# Patient Record
Sex: Female | Born: 1965 | Race: White | Hispanic: No | Marital: Married | Smoking: Never smoker
Health system: Southern US, Community
[De-identification: ages and names within clinical notes are randomized; demographics above are authoritative.]

## PROBLEM LIST (undated history)

## (undated) DIAGNOSIS — N200 Calculus of kidney: Secondary | ICD-10-CM

## (undated) DIAGNOSIS — E785 Hyperlipidemia, unspecified: Secondary | ICD-10-CM

## (undated) DIAGNOSIS — M1990 Unspecified osteoarthritis, unspecified site: Secondary | ICD-10-CM

## (undated) DIAGNOSIS — R896 Abnormal cytological findings in specimens from other organs, systems and tissues: Secondary | ICD-10-CM

## (undated) DIAGNOSIS — G43909 Migraine, unspecified, not intractable, without status migrainosus: Secondary | ICD-10-CM

## (undated) DIAGNOSIS — I10 Essential (primary) hypertension: Secondary | ICD-10-CM

## (undated) HISTORY — PX: KIDNEY SURGERY: SHX687

## (undated) HISTORY — DX: Unspecified osteoarthritis, unspecified site: M19.90

## (undated) HISTORY — DX: Essential (primary) hypertension: I10

## (undated) HISTORY — PX: BASAL CELL CARCINOMA EXCISION: SHX1214

## (undated) HISTORY — DX: Calculus of kidney: N20.0

## (undated) HISTORY — DX: Abnormal cytological findings in specimens from other organs, systems and tissues: R89.6

## (undated) HISTORY — DX: Migraine, unspecified, not intractable, without status migrainosus: G43.909

## (undated) HISTORY — DX: Hyperlipidemia, unspecified: E78.5

---
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
## 1998-04-22 ENCOUNTER — Inpatient Hospital Stay (HOSPITAL_COMMUNITY): Admission: AD | Admit: 1998-04-22 | Discharge: 1998-04-27 | Payer: Self-pay | Admitting: Obstetrics and Gynecology

## 1999-10-01 ENCOUNTER — Encounter: Payer: Self-pay | Admitting: Emergency Medicine

## 1999-10-01 ENCOUNTER — Emergency Department (HOSPITAL_COMMUNITY): Admission: EM | Admit: 1999-10-01 | Discharge: 1999-10-01 | Payer: Self-pay | Admitting: Emergency Medicine

## 1999-10-04 ENCOUNTER — Encounter: Payer: Self-pay | Admitting: Urology

## 1999-10-04 ENCOUNTER — Ambulatory Visit (HOSPITAL_COMMUNITY): Admission: RE | Admit: 1999-10-04 | Discharge: 1999-10-04 | Payer: Self-pay | Admitting: Urology

## 2000-10-09 ENCOUNTER — Encounter: Payer: Self-pay | Admitting: Urology

## 2000-10-09 ENCOUNTER — Encounter: Admission: RE | Admit: 2000-10-09 | Discharge: 2000-10-09 | Payer: Self-pay | Admitting: Urology

## 2001-09-13 ENCOUNTER — Other Ambulatory Visit: Admission: RE | Admit: 2001-09-13 | Discharge: 2001-09-13 | Payer: Self-pay | Admitting: Obstetrics and Gynecology

## 2002-07-28 ENCOUNTER — Other Ambulatory Visit: Admission: RE | Admit: 2002-07-28 | Discharge: 2002-07-28 | Payer: Self-pay | Admitting: Gynecology

## 2003-02-25 ENCOUNTER — Inpatient Hospital Stay (HOSPITAL_COMMUNITY): Admission: AD | Admit: 2003-02-25 | Discharge: 2003-02-28 | Payer: Self-pay | Admitting: Gynecology

## 2003-04-08 ENCOUNTER — Other Ambulatory Visit: Admission: RE | Admit: 2003-04-08 | Discharge: 2003-04-08 | Payer: Self-pay | Admitting: Gynecology

## 2004-08-03 ENCOUNTER — Other Ambulatory Visit: Admission: RE | Admit: 2004-08-03 | Discharge: 2004-08-03 | Payer: Self-pay | Admitting: Gynecology

## 2005-08-21 ENCOUNTER — Other Ambulatory Visit: Admission: RE | Admit: 2005-08-21 | Discharge: 2005-08-21 | Payer: Self-pay | Admitting: Gynecology

## 2006-09-03 ENCOUNTER — Other Ambulatory Visit: Admission: RE | Admit: 2006-09-03 | Discharge: 2006-09-03 | Payer: Self-pay | Admitting: Gynecology

## 2007-01-09 ENCOUNTER — Ambulatory Visit (HOSPITAL_COMMUNITY): Admission: RE | Admit: 2007-01-09 | Discharge: 2007-01-09 | Payer: Self-pay | Admitting: Urology

## 2007-01-12 ENCOUNTER — Emergency Department (HOSPITAL_COMMUNITY): Admission: EM | Admit: 2007-01-12 | Discharge: 2007-01-12 | Payer: Self-pay | Admitting: Emergency Medicine

## 2007-09-26 DIAGNOSIS — IMO0001 Reserved for inherently not codable concepts without codable children: Secondary | ICD-10-CM

## 2007-09-26 HISTORY — DX: Reserved for inherently not codable concepts without codable children: IMO0001

## 2007-09-26 HISTORY — PX: COLPOSCOPY: SHX161

## 2007-09-30 ENCOUNTER — Other Ambulatory Visit: Admission: RE | Admit: 2007-09-30 | Discharge: 2007-09-30 | Payer: Self-pay | Admitting: Gynecology

## 2008-03-10 ENCOUNTER — Emergency Department (HOSPITAL_COMMUNITY): Admission: EM | Admit: 2008-03-10 | Discharge: 2008-03-10 | Payer: Self-pay | Admitting: Emergency Medicine

## 2008-10-29 ENCOUNTER — Ambulatory Visit: Payer: Self-pay | Admitting: Gynecology

## 2008-10-29 ENCOUNTER — Other Ambulatory Visit: Admission: RE | Admit: 2008-10-29 | Discharge: 2008-10-29 | Payer: Self-pay | Admitting: Gynecology

## 2008-10-29 ENCOUNTER — Encounter: Payer: Self-pay | Admitting: Gynecology

## 2008-12-27 IMAGING — CT CT ABDOMEN W/O CM
2 of 5 series · 17 of 46 positions shown, 19 images · non-contrast
Comparison: None

CT ABDOMEN

CLINICAL DATA: Right flank pain history of renal stones

CT OF THE ABDOMEN AND PELVIS WITHOUT CONTRAST (CT UROGRAM)
TECHNIQUE: Multidetector CT imaging was performed through the
abdomen and pelvis to include the urinary tract.

[Series 2: 160 stone 5.0 b40f st · axial · 0.63mm/px · z∈[+696,+1026]mm · 14 of 72 slices shown, 16 images]
[im 3/72  soft-tissue]
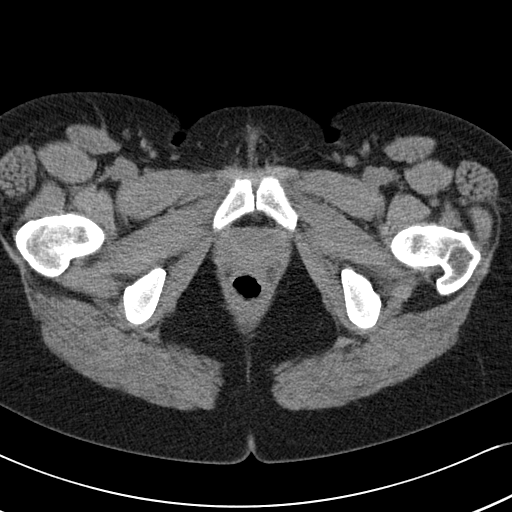
[im 3/72  bone]
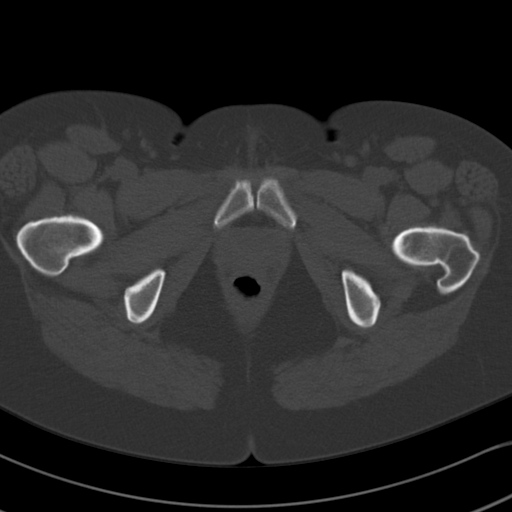
[im 9/72  soft-tissue]
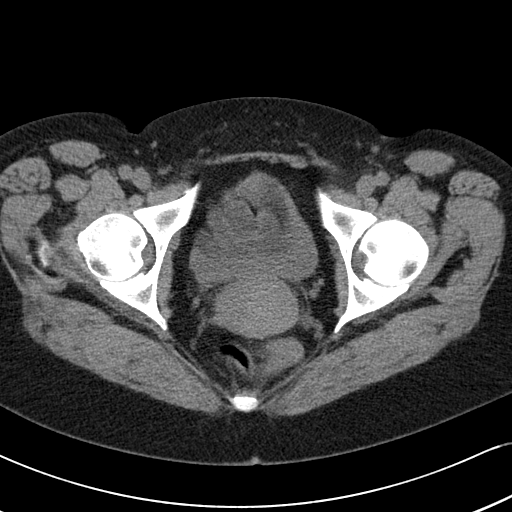
[im 14/72  soft-tissue]
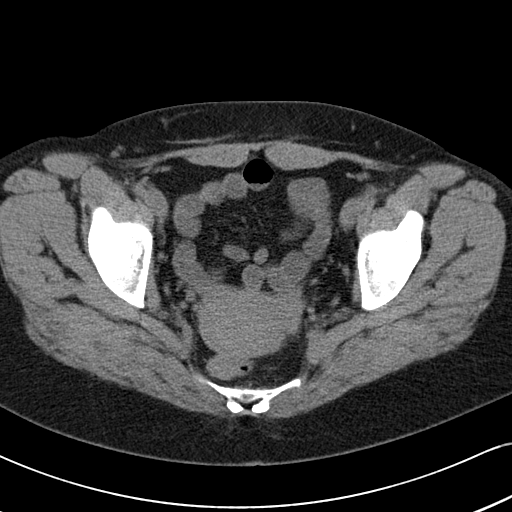
[im 20/72  soft-tissue]
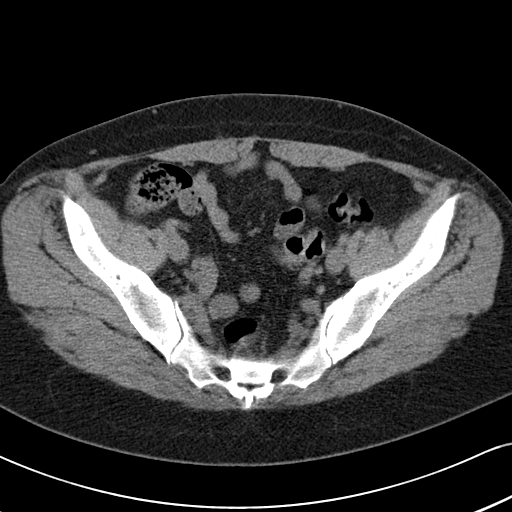
[im 25/72  soft-tissue]
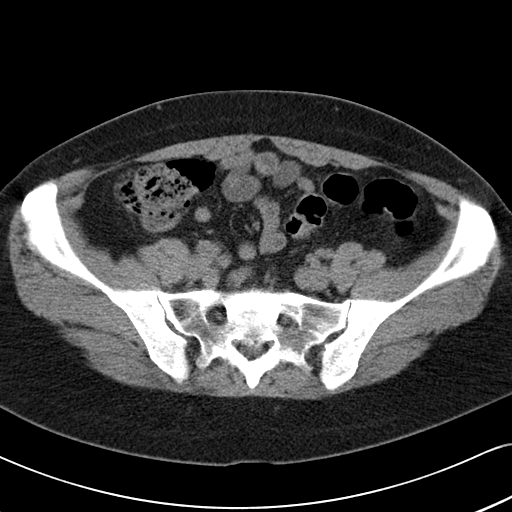
[im 28/72  soft-tissue]
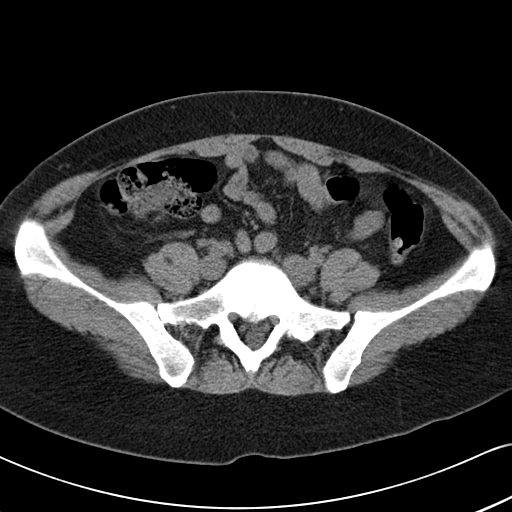
[im 33/72  soft-tissue]
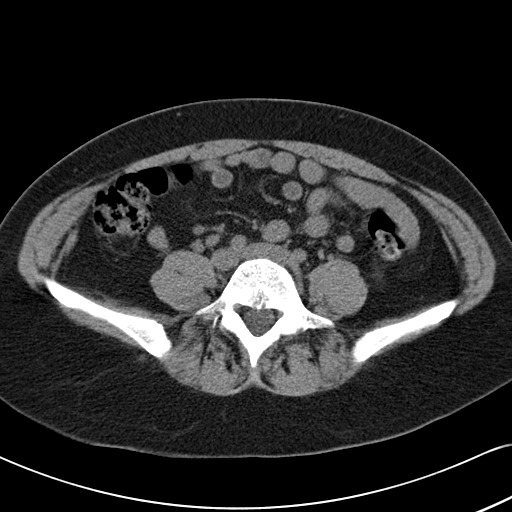
[im 39/72  soft-tissue]
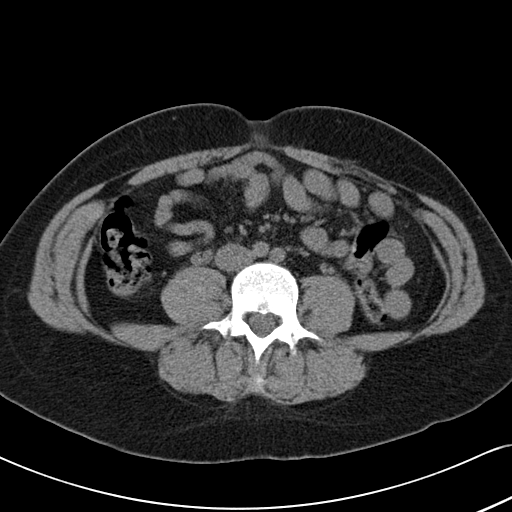
[im 44/72  soft-tissue]
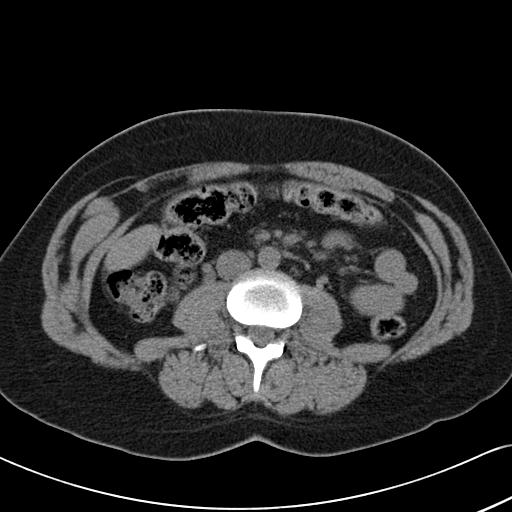
[im 44/72  bone]
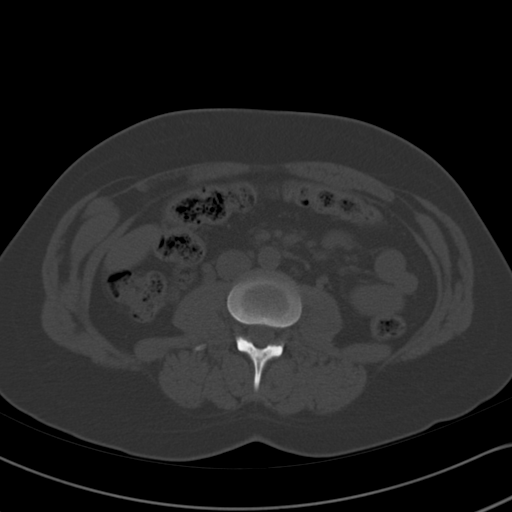
[im 47/72  soft-tissue]
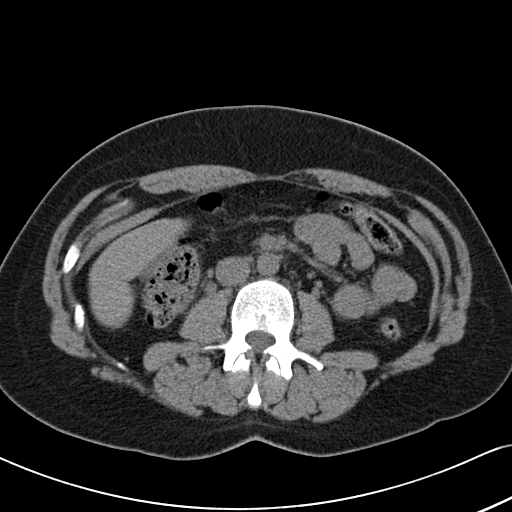
[im 52/72  soft-tissue]
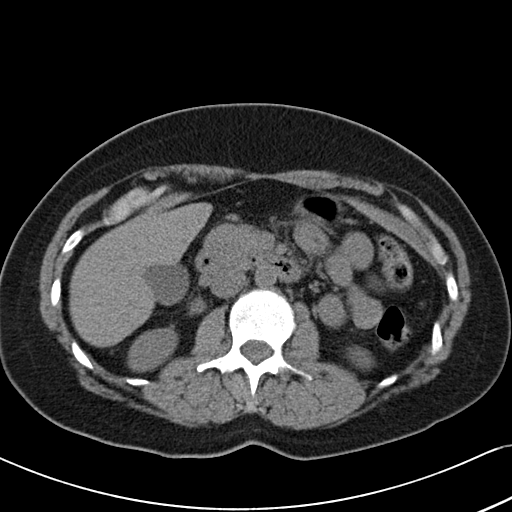
[im 58/72  soft-tissue]
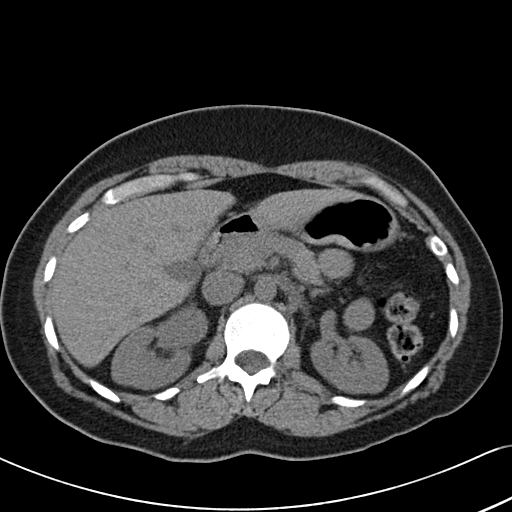
[im 63/72  soft-tissue]
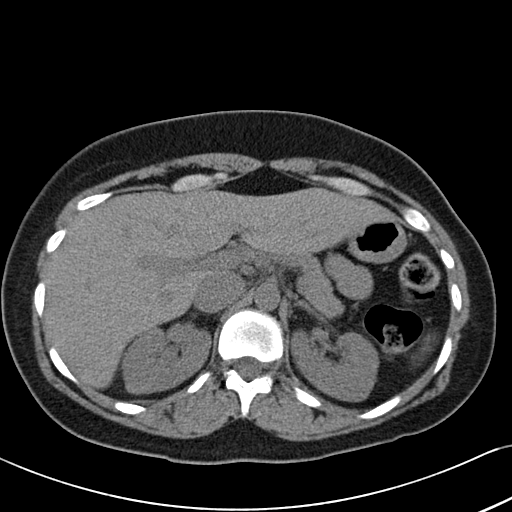
[im 69/72  soft-tissue]
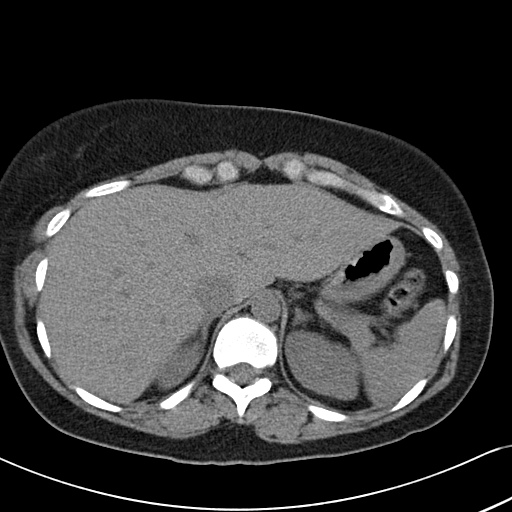

[Series 602: <mpr thick range> · coronal · 0.74mm/px · 3 of 57 slices shown]
[im 19/57  soft-tissue]
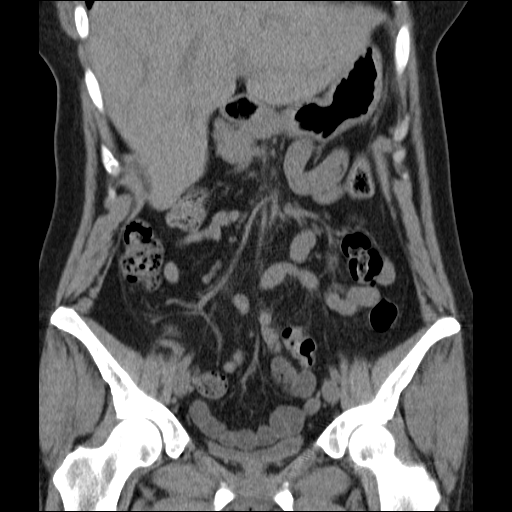
[im 25/57  soft-tissue]
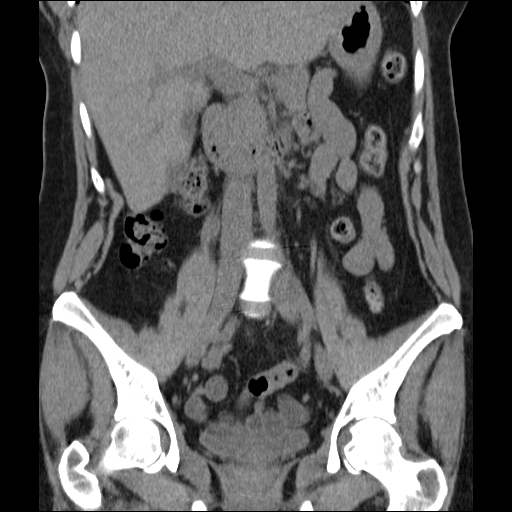
[im 32/57  soft-tissue]
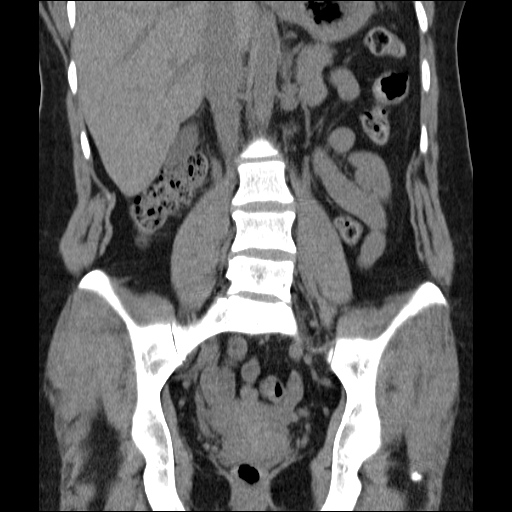

[17 of 46 positions shown; findings below may reference images not displayed]

FINDINGS: Two - 3 mm calculus in the right upper pole collecting
system.  Similar sized calculus in the mid left kidney.  Minimal
dilatation of the right collecting system.  Prominent dilated right
extrarenal pelvis.  Dilatation of the right ureter.
IMPRESSION: Mild right hydroureteronephrosis.  Bilateral renal calculi.

CT PELVIS
FINDINGS: Approximate 3 mm calculus in the region of the right
ureterovesical junction.  Probable mild dilatation of the distal
right ureter.
IMPRESSION: Findings are subtle but consistent with a 3 mm calculus at the
right UVJ.

Unfortunately, there was some delay in interpretation and release
of this report due to malfunction of the Speech recognition
program.

## 2009-08-12 ENCOUNTER — Emergency Department (HOSPITAL_COMMUNITY): Admission: EM | Admit: 2009-08-12 | Discharge: 2009-08-13 | Payer: Self-pay | Admitting: Emergency Medicine

## 2010-01-26 ENCOUNTER — Other Ambulatory Visit: Admission: RE | Admit: 2010-01-26 | Discharge: 2010-01-26 | Payer: Self-pay | Admitting: Gynecology

## 2010-01-26 ENCOUNTER — Ambulatory Visit: Payer: Self-pay | Admitting: Gynecology

## 2010-10-17 ENCOUNTER — Ambulatory Visit
Admission: RE | Admit: 2010-10-17 | Discharge: 2010-10-17 | Payer: Self-pay | Source: Home / Self Care | Attending: Gynecology | Admitting: Gynecology

## 2010-12-28 LAB — POCT I-STAT, CHEM 8
BUN: 17 mg/dL (ref 6–23)
Chloride: 107 mEq/L (ref 96–112)
Potassium: 4.8 mEq/L (ref 3.5–5.1)
Sodium: 135 mEq/L (ref 135–145)
TCO2: 22 mmol/L (ref 0–100)

## 2010-12-28 LAB — URINALYSIS, ROUTINE W REFLEX MICROSCOPIC
Ketones, ur: NEGATIVE mg/dL
Nitrite: NEGATIVE
Protein, ur: 30 mg/dL — AB
Urobilinogen, UA: 0.2 mg/dL (ref 0.0–1.0)

## 2010-12-28 LAB — PREGNANCY, URINE: Preg Test, Ur: NEGATIVE

## 2010-12-28 LAB — URINE MICROSCOPIC-ADD ON

## 2011-01-30 ENCOUNTER — Other Ambulatory Visit (HOSPITAL_COMMUNITY)
Admission: RE | Admit: 2011-01-30 | Discharge: 2011-01-30 | Disposition: A | Discharge status: Home / Self Care | Payer: BC Managed Care – PPO | Source: Ambulatory Visit | Attending: Gynecology | Admitting: Gynecology

## 2011-01-30 ENCOUNTER — Other Ambulatory Visit: Payer: Self-pay | Admitting: Gynecology

## 2011-01-30 ENCOUNTER — Encounter (INDEPENDENT_AMBULATORY_CARE_PROVIDER_SITE_OTHER): Payer: BC Managed Care – PPO | Admitting: Gynecology

## 2011-01-30 DIAGNOSIS — Z833 Family history of diabetes mellitus: Secondary | ICD-10-CM

## 2011-01-30 DIAGNOSIS — Z01419 Encounter for gynecological examination (general) (routine) without abnormal findings: Secondary | ICD-10-CM

## 2011-01-30 DIAGNOSIS — Z13220 Encounter for screening for lipoid disorders: Secondary | ICD-10-CM

## 2011-01-30 DIAGNOSIS — Z124 Encounter for screening for malignant neoplasm of cervix: Secondary | ICD-10-CM | POA: Insufficient documentation

## 2011-01-30 DIAGNOSIS — R823 Hemoglobinuria: Secondary | ICD-10-CM

## 2011-02-10 NOTE — Op Note (Signed)
Anna Mccullough, Anna Mccullough            ACCOUNT NO.:  [id number]   MEDICAL RECORD NO.:  [id number]          PATIENT TYPE:  AMB   LOCATION:  DAY                          FACILITY:  [facility]   PHYSICIAN:  Jamison Neighbor, M.D.  DATE OF BIRTH:  1966/02/08   DATE OF PROCEDURE:  01/09/2007  DATE OF DISCHARGE:                               OPERATIVE REPORT   PREOPERATIVE DIAGNOSIS:  Right ureteral calculus with hydronephrosis.   POSTOPERATIVE DIAGNOSIS:  Right ureteral calculus with hydronephrosis.   PROCEDURE:  Cystoscopy, right retrograde with interpretation, right  ureteroscopy, and mass extraction.   SURGEON:  Jamison Neighbor, M.D.   ANESTHESIA:  General.   COMPLICATIONS:  None.   DRAINS:  None.   BRIEF HISTORY:  45 year old female developed severe right-sided renal  colic.  She has had previous stone extraction.  The patient was given  pain medication as well as alpha blockade but she ended up in the office  several times for pain therapy.  She is now to undergo extraction of the  stone.  The patient understands the risks and benefits of the procedure  and gave full informed consent.   PROCEDURE:  After successful induction of general anesthesia the patient  was placed in the dorsal lithotomy position, prepped with Betadine and  draped in usual sterile fashion.  Careful bimanual examination revealed  no cystocele, rectocele or enterocele.  There were no masses on bimanual  exam.  The cystoscope was inserted.  Both ureteral orifices normal  configuration and location.  The bladder was free of any tumors or  stones.  A right retrograde study was performed using 6-French ureteral  catheter which was inserted in the ureteral orifice.  After the catheter  was inserted, contrast was injected.  The patient appeared to have a  narrowing of the distal ureter with what appeared to be a small filling  defect.  The more proximal ureteral all the way up to the collecting  system was dilated.   The collecting system was otherwise unremarkable.  No tumors or stones seen.  Ureteral catheter was advanced beyond the  obstruction and good hydronephrotic drip was obtained.  A guidewire was  advanced up to the kidney under fluoroscopic control.  The ureteral  catheter was removed as was the cystoscope leaving the wire in place.  Ureteroscopy is performed.  The ureteroscope was inserted in the distal  ureter and then it became clear that it could not be advanced much  beyond the intramural tunnel. For that reason a ureteral access sheath  was utilized to dilate the distal ureter.  After completion of dilation,  ureteroscope was advanced all way up to the kidney.  As the ureteroscope  was withdrawn the stone could be identified.  It was grasped with the  basket and removed under direct vision.  The ureteroscope was  reinserted.  There was no sign of any injury or trauma to the ureter and  given the  atraumatic nature of the extraction and simple nature of the extraction  it was felt that a stent did not need to be placed.  The kidney  decompressed nicely after the stone had been removed.  The bladder was  drained.  The patient tolerated the procedure well and was taken to  recovery in good condition.           ______________________________  Jamison Neighbor, M.D.  Electronically Signed     RJE/MEDQ  D:  01/09/2007  T:  01/10/2007  Job:  [id number]

## 2011-02-10 NOTE — Op Note (Signed)
NAME:  Anna Mccullough, Anna Mccullough                      ACCOUNT NO.:  [id number]   MEDICAL RECORD NO.:  [id number]                   PATIENT TYPE:  INP   LOCATION:  9198                                 FACILITY:  [facility]   PHYSICIAN:  Timothy P. Fontaine, M.D.           DATE OF BIRTH:  July 22, 1966   DATE OF PROCEDURE:  02/25/2003  DATE OF DISCHARGE:                                 OPERATIVE REPORT   PREOPERATIVE DIAGNOSES:  1. Term pregnancy.  2. Prior cesarean section, desires repeat cesarean section.   POSTOPERATIVE DIAGNOSES:  1. Term pregnancy.  2. Prior cesarean section, desires repeat cesarean section.   PROCEDURE:  Repeat low transverse cervical cesarean section.   SURGEON:  Timothy P. Fontaine, M.D.   ASSISTANT:  Ivor Costa. Farrel Gobble, M.D.   ANESTHESIA:  Spinal.   ESTIMATED BLOOD LOSS:  Less than 500 cc.   COMPLICATIONS:  None.   SPECIMENS:  Samples of cord blood.   FINDINGS:  At 73, normal female infant; Apgars 9 and 9.  Weight 7 pounds 7  ounces; noted to be in a transverse back down lie, converted to a vertex and  subsequently delivered.  Nuchal cord x1.  Pelvic anatomy noted to be normal.   DESCRIPTION OF PROCEDURE:  The patient was taken to the operating room and  underwent spinal anesthesia.  She was then placed in the left tilt supine  position, received an abdominal preparation with Betadine scrubbing solution  by the nursing personnel.  A Foley catheter was placed with sterile  technique, and the patient was draped in the usual fashion.  After assuring  adequate anesthesia, the abdomen was carefully entered through a repeat  Pfannenstiel incision, achieving adequate hemostasis at all levels.  The  bladder flap was sharply and bluntly developed.  The uterus was sharply  entered in the lower uterine segment, and bluntly extended laterally.  The  membranes were ruptured.  The fluid was noted to be clear.  The infant was  then found to be in the transverse back down  lie.   The infant was converted to a vertex presentation.  The vertex was delivered  with the assistance of the vacuum extractor.  Nares and mouth were  suctioned.  Nuchal cord reduced.  The rest of the infant was delivered, the  cord doubly clamped and cut, and the infant handed to pediatrics in  attendance.   Samples of cord blood were obtained.  Placenta was spontaneously extruded  and noted to be intact.  The uterus was exteriorized; endometrial cavity  explored with a sponge to remove all placental membrane fragments.  The  uterine incision was closed in one layer using 0 Vicryl suture in a running,  interlocking stitch.  Several figure-of-eight sutures were placed to achieve  ultimate hemostasis.   The uterus was returned to the abdomen, which was copiously irrigated and  noting adequate hemostasis.  The anterior fascia was then reapproximated  using 0  Vicryl suture in a running stitch.  Subcutaneous tissue was  irrigated and good hemostasis was achieved with electrocautery.  The skin  was reapproximated with staples.  Steri-Strips were applied between the  staples with Benzoin.  A sterile dressing was applied.  The patient was  taken to the recovery room in good condition, having tolerated the procedure  well.                                               Timothy P. Audie Box, M.D.    TPF/MEDQ  D:  02/25/2003  T:  02/25/2003  Job:  [id number]

## 2011-02-10 NOTE — Discharge Summary (Signed)
   NAME:  Anna Mccullough, Anna Mccullough                      ACCOUNT NO.:  [id number]   MEDICAL RECORD NO.:  [id number]                   PATIENT TYPE:  INP   LOCATION:  9131                                 FACILITY:  [facility]   PHYSICIAN:  Timothy P. Fontaine, M.D.           DATE OF BIRTH:  Jul 25, 1966   DATE OF ADMISSION:  02/25/2003  DATE OF DISCHARGE:  02/28/2003                                 DISCHARGE SUMMARY   DISCHARGE DIAGNOSES:  1. Intrauterine pregnancy at term.  2. For repeat scheduled Cesarean section.   PROCEDURE:  Repeat C-section.   HISTORY OF PRESENT ILLNESS:  A 45 year old gravida 2, para 1 with history of  prior C-section and desires a repeat. Her EDC is March 04, 2003. She was 38  and 6 weeks. Her blood type was 0. She had no prenatal complications during  her pregnancy.   LABORATORY DATA:  Blood type is O+, antibody negative, serology nonreactive,  Rubella titer positive, hepatitis and HIV were nonreactive. She does have a  history of positive B strep with last pregnancy.   HOSPITAL COURSE:  She had a repeat C-section on February 25, 2003. Had a delivery  of a viable female with Apgar's of 9 and 9 with a birth weight of 7 pounds, 7  ounces. Postpartum, she remained afebrile and did well. She was discharged  home in satisfactory condition on her first postpartum day   LABORATORY DATA:  On discharge, white count 12.1, hemoglobin 10.8,  hematocrit 31.6, platelets 150,000.   DISPOSITION:  She was discharged home with instructions to followup every 6  weeks or as needed. She received a discharge booklet. Staples were removed.  Steri-strips applied. Prescription for Tylox was given.     Davonna Belling. Young, N.P.                      Timothy P. Audie Box, M.D.    [facility]  D:  03/19/2003  T:  03/21/2003  Job:  [id number]

## 2011-02-10 NOTE — H&P (Signed)
   NAME:  Anna Mccullough, Anna Mccullough                      ACCOUNT NO.:  [id number]   MEDICAL RECORD NO.:  [id number]                   PATIENT TYPE:  INP   LOCATION:  NA                                   FACILITY:  [facility]   PHYSICIAN:  Timothy P. Fontaine, M.D.           DATE OF BIRTH:  [birth year]   DATE OF ADMISSION:  02/25/2003  DATE OF DISCHARGE:                                HISTORY & PHYSICAL   CHIEF COMPLAINT:  Pregnancy at term, history of prior cesarean section,  desires repeat cesarean section, history of positive GBS.   HISTORY OF PRESENT ILLNESS:  A 45 year old G30, P47 female at term gestation,  history of prior cesarean section who desires repeat cesarean section after  counseling for trial of labor.  For the remainder of her history and  physical see her [facility].   PHYSICAL EXAMINATION:  HEENT:  Normal.  LUNGS:  Clear.  CARDIAC:  Regular rate.  No rubs, murmurs, or gallops.  ABDOMEN:  Gravid, vertex fetus.  Positive fetal heart tones.  PELVIC:  Deferred.   ASSESSMENT/PLAN:  A 45 year old G2, P17 female, history of prior cesarean  section.  Desires repeat cesarean section after counseling for trial of  labor.  I discussed the risks of repeat cesarean section to include  bleeding, transfusion, infection, wound complications necessitating opening  and draining of incisions, closure by secondary intention, damage to  internal organs including bowel, bladder, ureters, vessels, and nerves  necessitating major exploratory reparative surgeries and future reparative  surgeries including ostomy formation.  Fetal injury including scalpal,  neuromuscular injuries were discussed, understood, and accepted.  The  patient's questions were answered.  She is ready to proceed with surgery.                                               Timothy P. Audie Box, M.D.    TPF/MEDQ  D:  02/24/2003  T:  02/24/2003  Job:  [id number]

## 2011-06-22 LAB — URINALYSIS, ROUTINE W REFLEX MICROSCOPIC
Glucose, UA: NEGATIVE
Leukocytes, UA: NEGATIVE
Protein, ur: NEGATIVE
Specific Gravity, Urine: 1.025

## 2011-06-22 LAB — URINE MICROSCOPIC-ADD ON

## 2011-09-08 ENCOUNTER — Encounter: Payer: Self-pay | Admitting: Gynecology

## 2012-01-15 ENCOUNTER — Telehealth: Payer: Self-pay | Admitting: *Deleted

## 2012-01-15 NOTE — Telephone Encounter (Signed)
Pt called stating pharmacy said that her junel fe 1/20 was on back order, pt will call around to another pharmacy to check if they had them.

## 2012-02-02 ENCOUNTER — Encounter: Payer: Self-pay | Admitting: Gynecology

## 2012-02-02 ENCOUNTER — Ambulatory Visit (INDEPENDENT_AMBULATORY_CARE_PROVIDER_SITE_OTHER): Payer: BC Managed Care – PPO | Admitting: Gynecology

## 2012-02-02 ENCOUNTER — Other Ambulatory Visit (HOSPITAL_COMMUNITY)
Admission: RE | Admit: 2012-02-02 | Discharge: 2012-02-02 | Disposition: A | Discharge status: Home / Self Care | Payer: BC Managed Care – PPO | Source: Ambulatory Visit | Attending: Gynecology | Admitting: Gynecology

## 2012-02-02 ENCOUNTER — Encounter: Payer: BC Managed Care – PPO | Admitting: Gynecology

## 2012-02-02 VITALS — BP 128/76 | Ht 66.0 in | Wt 140.0 lb

## 2012-02-02 DIAGNOSIS — Z13220 Encounter for screening for lipoid disorders: Secondary | ICD-10-CM

## 2012-02-02 DIAGNOSIS — Z1159 Encounter for screening for other viral diseases: Secondary | ICD-10-CM | POA: Insufficient documentation

## 2012-02-02 DIAGNOSIS — Z01419 Encounter for gynecological examination (general) (routine) without abnormal findings: Secondary | ICD-10-CM | POA: Insufficient documentation

## 2012-02-02 DIAGNOSIS — Z131 Encounter for screening for diabetes mellitus: Secondary | ICD-10-CM

## 2012-02-02 LAB — CBC WITH DIFFERENTIAL/PLATELET
Eosinophils Relative: 1 % (ref 0–5)
HCT: 38.5 % (ref 36.0–46.0)
Lymphocytes Relative: 32 % (ref 12–46)
Lymphs Abs: 2.1 10*3/uL (ref 0.7–4.0)
MCV: 90.8 fL (ref 78.0–100.0)
Monocytes Absolute: 0.4 10*3/uL (ref 0.1–1.0)
RBC: 4.24 MIL/uL (ref 3.87–5.11)
RDW: 13.7 % (ref 11.5–15.5)
WBC: 6.7 10*3/uL (ref 4.0–10.5)

## 2012-02-02 LAB — LIPID PANEL
HDL: 45 mg/dL (ref >39)
LDL Cholesterol: 120 mg/dL — ABNORMAL HIGH (ref 0–99)
Triglycerides: 99 mg/dL (ref <150)
VLDL: 20 mg/dL (ref 0–40)

## 2012-02-02 MED ORDER — NORETHIN ACE-ETH ESTRAD-FE 1-20 MG-MCG PO TABS: 1.0000 | ORAL_TABLET | Freq: Every day | ORAL | Status: DC

## 2012-02-02 NOTE — Patient Instructions (Signed)
Follow up in one year for annual gynecologic exam. 

## 2012-02-02 NOTE — Progress Notes (Signed)
GRACEYN FODOR March 12, 1966 [id number]        46 y.o.  for annual exam.  Doing well without complaints  Past medical history,surgical history, medications, allergies, family history and social history were all reviewed and documented in the EPIC chart. ROS:  Was performed and pertinent positives and negatives are included in the history.  Exam: Amy chaperone present Filed Vitals:   02/02/12 0948  BP: 128/76   General appearance  Normal Skin grossly normal Head/Neck normal with no cervical or supraclavicular adenopathy thyroid normal Lungs  clear Cardiac RR, without RMG Abdominal  soft, nontender, without masses, organomegaly or hernia Breasts  examined lying and sitting without masses, retractions, discharge or axillary adenopathy. Pelvic  Ext/BUS/vagina  normal   Cervix  normal Pap done  Uterus  anteverted, normal size, shape and contour, midline and mobile nontender   Adnexa  Without masses or tenderness    Anus and perineum  normal   Rectovaginal  normal sphincter tone without palpated masses or tenderness.    Assessment/Plan:  46 y.o. female for annual exam.    1. History ASCUS negative high-risk HPV 2007 with repeat ASCUS 2009. Colposcopy and biopsy was negative.  Pap done today with high-risk HPV screen. Assuming negative then repeat in 5 years per current screening guidelines. 2. Oral contraceptives. Patients on Loestrin 120 equivalence. She's doing well wants to continue. I reviewed the risks to include stroke heart attack DVT. Alternatives to include sterilization, IUD reviewed. Patient's comfortable with the pills wants to continue and I refilled her times a year. 3. Mammography. Patient had her mammogram December 2012. Continue with annual mammography. SBE monthly reviewed. 4. Health maintenance. Baseline CBC lipid profile glucose urinalysis ordered. 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  Assuming she continues well from a gynecologic standpoint she will see me in a year, sooner as needed.    Dara Lords MD, 10:45 AM 02/02/2012

## 2012-02-03 LAB — URINALYSIS W MICROSCOPIC + REFLEX CULTURE
Bacteria, UA: NONE SEEN
Ketones, ur: NEGATIVE mg/dL
Nitrite: NEGATIVE
Urobilinogen, UA: 0.2 mg/dL (ref 0.0–1.0)

## 2012-05-06 ENCOUNTER — Encounter: Payer: Self-pay | Admitting: *Deleted

## 2012-05-06 NOTE — Progress Notes (Signed)
Patient ID: Anna Mccullough, female   DOB: Jun 01, 1966, 46 y.o.   MRN: [id number] Pt called c/o UTI requesting medication, left message on voicemail OV best. Pt last seen in march.

## 2012-10-10 ENCOUNTER — Encounter: Payer: Self-pay | Admitting: Gynecology

## 2013-01-05 ENCOUNTER — Other Ambulatory Visit: Payer: Self-pay | Admitting: Gynecology

## 2013-02-03 ENCOUNTER — Encounter: Payer: Self-pay | Admitting: Gynecology

## 2013-02-03 ENCOUNTER — Ambulatory Visit (INDEPENDENT_AMBULATORY_CARE_PROVIDER_SITE_OTHER): Payer: BC Managed Care – PPO | Admitting: Gynecology

## 2013-02-03 VITALS — BP 110/64 | Ht 65.0 in | Wt 136.0 lb

## 2013-02-03 DIAGNOSIS — Z309 Encounter for contraceptive management, unspecified: Secondary | ICD-10-CM

## 2013-02-03 DIAGNOSIS — Z01419 Encounter for gynecological examination (general) (routine) without abnormal findings: Secondary | ICD-10-CM

## 2013-02-03 DIAGNOSIS — Z13220 Encounter for screening for lipoid disorders: Secondary | ICD-10-CM

## 2013-02-03 LAB — CBC WITH DIFFERENTIAL/PLATELET
Basophils Absolute: 0 10*3/uL (ref 0.0–0.1)
HCT: 39.5 % (ref 36.0–46.0)
Hemoglobin: 13.4 g/dL (ref 12.0–15.0)
Lymphocytes Relative: 37 % (ref 12–46)
Monocytes Absolute: 0.4 10*3/uL (ref 0.1–1.0)
Neutro Abs: 3.8 10*3/uL (ref 1.7–7.7)
RDW: 13.1 % (ref 11.5–15.5)
WBC: 6.9 10*3/uL (ref 4.0–10.5)

## 2013-02-03 LAB — COMPREHENSIVE METABOLIC PANEL
ALT: 8 U/L (ref 0–35)
AST: 14 U/L (ref 0–37)
Albumin: 4.1 g/dL (ref 3.5–5.2)
BUN: 16 mg/dL (ref 6–23)
Calcium: 9.2 mg/dL (ref 8.4–10.5)
Chloride: 101 mEq/L (ref 96–112)
Potassium: 4.2 mEq/L (ref 3.5–5.3)
Total Protein: 6.6 g/dL (ref 6.0–8.3)

## 2013-02-03 LAB — LIPID PANEL: HDL: 53 mg/dL (ref >39)

## 2013-02-03 MED ORDER — NORETHIN ACE-ETH ESTRAD-FE 1-20 MG-MCG PO TABS: ORAL_TABLET | ORAL | Status: DC

## 2013-02-03 NOTE — Progress Notes (Signed)
TEKA CHANDA November 01, 1965 [id number]        47 y.o.  G2P2002 for annual exam.  Doing well without complaints.  Past medical history,surgical history, medications, allergies, family history and social history were all reviewed and documented in the EPIC chart. ROS:  Was performed and pertinent positives and negatives are included in the history.  Exam: Kim assistant Filed Vitals:   02/03/13 1114  BP: 110/64  Height: 5\' 5"  (1.651 m)  Weight: 136 lb (61.689 kg)   General appearance  Normal Skin grossly normal Head/Neck normal with no cervical or supraclavicular adenopathy thyroid normal Lungs  clear Cardiac RR, without RMG Abdominal  soft, nontender, without masses, organomegaly or hernia Breasts  examined lying and sitting without masses, retractions, discharge or axillary adenopathy. Pelvic  Ext/BUS/vagina  normal   Cervix  normal   Uterus  anteverted, normal size, shape and contour, midline and mobile nontender   Adnexa  Without masses or tenderness    Anus and perineum  normal   Rectovaginal  normal sphincter tone without palpated masses or tenderness.    Assessment/Plan:  48 y.o. G21P2002 female for annual exam, regular menses, oral contraceptives.   1. BCPs. Doing well wants to continue. Reviewed risks to include increased risk of stroke heart attack DVT. Possible increased risk with advancing age. No history of medical issues or cigarette use. Patient accepts risks and I refilled her Loestrin 1/20 equivalents times a year. 2. Pap smear 2013 with negative HPV. No Pap smear done today. No history of significant abnormal Pap smears. Did have ascus in 2009 with negative HPV. Plan repeat in 5 year interval. 3. Mammography 09/2012. Continued annual mammography. SBE monthly reviewed. 4. Maintenance. Baseline CBC comprehensive metabolic panel lipid profile urinalysis ordered. Followup one year, sooner as needed.    Dara Lords MD, 11:45 AM 02/03/2013

## 2013-02-03 NOTE — Patient Instructions (Signed)
Follow up in one year for annual exam 

## 2013-02-04 LAB — URINALYSIS W MICROSCOPIC + REFLEX CULTURE
Casts: NONE SEEN
Crystals: NONE SEEN
Glucose, UA: NEGATIVE mg/dL
Leukocytes, UA: NEGATIVE
Specific Gravity, Urine: 1.028 (ref 1.005–1.030)
pH: 5.5 (ref 5.0–8.0)

## 2013-10-21 ENCOUNTER — Encounter: Payer: Self-pay | Admitting: Gynecology

## 2014-01-07 ENCOUNTER — Other Ambulatory Visit: Payer: Self-pay | Admitting: Gynecology

## 2014-01-08 ENCOUNTER — Other Ambulatory Visit: Payer: Self-pay | Admitting: Gynecology

## 2014-02-24 ENCOUNTER — Ambulatory Visit (INDEPENDENT_AMBULATORY_CARE_PROVIDER_SITE_OTHER): Payer: BC Managed Care – PPO | Admitting: Gynecology

## 2014-02-24 ENCOUNTER — Encounter: Payer: Self-pay | Admitting: Gynecology

## 2014-02-24 VITALS — BP 120/70 | Ht 65.0 in | Wt 133.0 lb

## 2014-02-24 DIAGNOSIS — Z01419 Encounter for gynecological examination (general) (routine) without abnormal findings: Secondary | ICD-10-CM

## 2014-02-24 LAB — CBC WITH DIFFERENTIAL/PLATELET
BASOS PCT: 1 % (ref 0–1)
Basophils Absolute: 0.1 10*3/uL (ref 0.0–0.1)
Eosinophils Absolute: 0.1 10*3/uL (ref 0.0–0.7)
Eosinophils Relative: 1 % (ref 0–5)
HCT: 39.7 % (ref 36.0–46.0)
HEMOGLOBIN: 13.5 g/dL (ref 12.0–15.0)
LYMPHS ABS: 2.4 10*3/uL (ref 0.7–4.0)
LYMPHS PCT: 40 % (ref 12–46)
MCH: 29.9 pg (ref 26.0–34.0)
MCHC: 34 g/dL (ref 30.0–36.0)
MCV: 88 fL (ref 78.0–100.0)
MONOS PCT: 7 % (ref 3–12)
Monocytes Absolute: 0.4 10*3/uL (ref 0.1–1.0)
NEUTROS ABS: 3.1 10*3/uL (ref 1.7–7.7)
NEUTROS PCT: 51 % (ref 43–77)
Platelets: 263 10*3/uL (ref 150–400)
RBC: 4.51 MIL/uL (ref 3.87–5.11)
RDW: 13.4 % (ref 11.5–15.5)
WBC: 6.1 10*3/uL (ref 4.0–10.5)

## 2014-02-24 LAB — COMPREHENSIVE METABOLIC PANEL
ALBUMIN: 4 g/dL (ref 3.5–5.2)
ALK PHOS: 45 U/L (ref 39–117)
ALT: 9 U/L (ref 0–35)
AST: 13 U/L (ref 0–37)
BUN: 15 mg/dL (ref 6–23)
CHLORIDE: 100 meq/L (ref 96–112)
CO2: 30 mEq/L (ref 19–32)
Calcium: 8.9 mg/dL (ref 8.4–10.5)
Creat: 0.89 mg/dL (ref 0.50–1.10)
GLUCOSE: 72 mg/dL (ref 70–99)
POTASSIUM: 4 meq/L (ref 3.5–5.3)
SODIUM: 136 meq/L (ref 135–145)
TOTAL PROTEIN: 6.2 g/dL (ref 6.0–8.3)
Total Bilirubin: 0.4 mg/dL (ref 0.2–1.2)

## 2014-02-24 LAB — LIPID PANEL
Cholesterol: 193 mg/dL (ref 0–200)
HDL: 53 mg/dL (ref >39)
LDL CALC: 124 mg/dL — AB (ref 0–99)
TRIGLYCERIDES: 82 mg/dL (ref <150)
Total CHOL/HDL Ratio: 3.6 Ratio
VLDL: 16 mg/dL (ref 0–40)

## 2014-02-24 MED ORDER — NORETHIN ACE-ETH ESTRAD-FE 1-20 MG-MCG PO TABS: ORAL_TABLET | ORAL | Status: DC

## 2014-02-24 NOTE — Patient Instructions (Signed)
Followup in one year for annual exam, sooner if any issues.  You may obtain a copy of any labs that were done today by logging onto MyChart as outlined in the instructions provided with your AVS (after visit summary). The office will not call with normal lab results but certainly if there are any significant abnormalities then we will contact you.   Health Maintenance, Female A healthy lifestyle and preventative care can promote health and wellness.  Maintain regular health, dental, and eye exams.  Eat a healthy diet. Foods like vegetables, fruits, whole grains, low-fat dairy products, and lean protein foods contain the nutrients you need without too many calories. Decrease your intake of foods high in solid fats, added sugars, and salt. Get information about a proper diet from your caregiver, if necessary.  Regular physical exercise is one of the most important things you can do for your health. Most adults should get at least 150 minutes of moderate-intensity exercise (any activity that increases your heart rate and causes you to sweat) each week. In addition, most adults need muscle-strengthening exercises on 2 or more days a week.   Maintain a healthy weight. The body mass index (BMI) is a screening tool to identify possible weight problems. It provides an estimate of body fat based on height and weight. Your caregiver can help determine your BMI, and can help you achieve or maintain a healthy weight. For adults 20 years and older:  A BMI below 18.5 is considered underweight.  A BMI of 18.5 to 24.9 is normal.  A BMI of 25 to 29.9 is considered overweight.  A BMI of 30 and above is considered obese.  Maintain normal blood lipids and cholesterol by exercising and minimizing your intake of saturated fat. Eat a balanced diet with plenty of fruits and vegetables. Blood tests for lipids and cholesterol should begin at age 20 and be repeated every 5 years. If your lipid or cholesterol levels are  high, you are over 50, or you are a high risk for heart disease, you may need your cholesterol levels checked more frequently.Ongoing high lipid and cholesterol levels should be treated with medicines if diet and exercise are not effective.  If you smoke, find out from your caregiver how to quit. If you do not use tobacco, do not start.  Lung cancer screening is recommended for adults aged 55 80 years who are at high risk for developing lung cancer because of a history of smoking. Yearly low-dose computed tomography (CT) is recommended for people who have at least a 30-pack-year history of smoking and are a current smoker or have quit within the past 15 years. A pack year of smoking is smoking an average of 1 pack of cigarettes a day for 1 year (for example: 1 pack a day for 30 years or 2 packs a day for 15 years). Yearly screening should continue until the smoker has stopped smoking for at least 15 years. Yearly screening should also be stopped for people who develop a health problem that would prevent them from having lung cancer treatment.  If you are pregnant, do not drink alcohol. If you are breastfeeding, be very cautious about drinking alcohol. If you are not pregnant and choose to drink alcohol, do not exceed 1 drink per day. One drink is considered to be 12 ounces (355 mL) of beer, 5 ounces (148 mL) of wine, or 1.5 ounces (44 mL) of liquor.  Avoid use of street drugs. Do not share needles with   anyone. Ask for help if you need support or instructions about stopping the use of drugs.  High blood pressure causes heart disease and increases the risk of stroke. Blood pressure should be checked at least every 1 to 2 years. Ongoing high blood pressure should be treated with medicines, if weight loss and exercise are not effective.  If you are 55 to 46 years old, ask your caregiver if you should take aspirin to prevent strokes.  Diabetes screening involves taking a blood sample to check your fasting  blood sugar level. This should be done once every 3 years, after age 45, if you are within normal weight and without risk factors for diabetes. Testing should be considered at a younger age or be carried out more frequently if you are overweight and have at least 1 risk factor for diabetes.  Breast cancer screening is essential preventative care for women. You should practice "breast self-awareness." This means understanding the normal appearance and feel of your breasts and may include breast self-examination. Any changes detected, no matter how small, should be reported to a caregiver. Women in their 20s and 30s should have a clinical breast exam (CBE) by a caregiver as part of a regular health exam every 1 to 3 years. After age 40, women should have a CBE every year. Starting at age 40, women should consider having a mammogram (breast X-ray) every year. Women who have a family history of breast cancer should talk to their caregiver about genetic screening. Women at a high risk of breast cancer should talk to their caregiver about having an MRI and a mammogram every year.  Breast cancer gene (BRCA)-related cancer risk assessment is recommended for women who have family members with BRCA-related cancers. BRCA-related cancers include breast, ovarian, tubal, and peritoneal cancers. Having family members with these cancers may be associated with an increased risk for harmful changes (mutations) in the breast cancer genes BRCA1 and BRCA2. Results of the assessment will determine the need for genetic counseling and BRCA1 and BRCA2 testing.  The Pap test is a screening test for cervical cancer. Women should have a Pap test starting at age 21. Between ages 21 and 29, Pap tests should be repeated every 2 years. Beginning at age 30, you should have a Pap test every 3 years as long as the past 3 Pap tests have been normal. If you had a hysterectomy for a problem that was not cancer or a condition that could lead to  cancer, then you no longer need Pap tests. If you are between ages 65 and 70, and you have had normal Pap tests going back 10 years, you no longer need Pap tests. If you have had past treatment for cervical cancer or a condition that could lead to cancer, you need Pap tests and screening for cancer for at least 20 years after your treatment. If Pap tests have been discontinued, risk factors (such as a new sexual partner) need to be reassessed to determine if screening should be resumed. Some women have medical problems that increase the chance of getting cervical cancer. In these cases, your caregiver may recommend more frequent screening and Pap tests.  The human papillomavirus (HPV) test is an additional test that may be used for cervical cancer screening. The HPV test looks for the virus that can cause the cell changes on the cervix. The cells collected during the Pap test can be tested for HPV. The HPV test could be used to screen women aged 30   years and older, and should be used in women of any age who have unclear Pap test results. After the age of 30, women should have HPV testing at the same frequency as a Pap test.  Colorectal cancer can be detected and often prevented. Most routine colorectal cancer screening begins at the age of 50 and continues through age 75. However, your caregiver may recommend screening at an earlier age if you have risk factors for colon cancer. On a yearly basis, your caregiver may provide home test kits to check for hidden blood in the stool. Use of a small camera at the end of a tube, to directly examine the colon (sigmoidoscopy or colonoscopy), can detect the earliest forms of colorectal cancer. Talk to your caregiver about this at age 50, when routine screening begins. Direct examination of the colon should be repeated every 5 to 10 years through age 75, unless early forms of pre-cancerous polyps or small growths are found.  Hepatitis C blood testing is recommended for  all people born from 1945 through 1965 and any individual with known risks for hepatitis C.  Practice safe sex. Use condoms and avoid high-risk sexual practices to reduce the spread of sexually transmitted infections (STIs). Sexually active women aged 25 and younger should be checked for Chlamydia, which is a common sexually transmitted infection. Older women with new or multiple partners should also be tested for Chlamydia. Testing for other STIs is recommended if you are sexually active and at increased risk.  Osteoporosis is a disease in which the bones lose minerals and strength with aging. This can result in serious bone fractures. The risk of osteoporosis can be identified using a bone density scan. Women ages 65 and over and women at risk for fractures or osteoporosis should discuss screening with their caregivers. Ask your caregiver whether you should be taking a calcium supplement or vitamin D to reduce the rate of osteoporosis.  Menopause can be associated with physical symptoms and risks. Hormone replacement therapy is available to decrease symptoms and risks. You should talk to your caregiver about whether hormone replacement therapy is right for you.  Use sunscreen. Apply sunscreen liberally and repeatedly throughout the day. You should seek shade when your shadow is shorter than you. Protect yourself by wearing long sleeves, pants, a wide-brimmed hat, and sunglasses year round, whenever you are outdoors.  Notify your caregiver of new moles or changes in moles, especially if there is a change in shape or color. Also notify your caregiver if a mole is larger than the size of a pencil eraser.  Stay current with your immunizations. Document Released: 03/27/2011 Document Revised: 01/06/2013 Document Reviewed: 03/27/2011 [employer] Patient Information 2014 [employer].   

## 2014-02-24 NOTE — Progress Notes (Signed)
Anna Mccullough June 01, 1966 [id number]        48 y.o.  G2P2002 for annual exam.  Doing well without complaints.  Past medical history,surgical history, problem list, medications, allergies, family history and social history were all reviewed and documented as reviewed in the EPIC chart.  ROS:  12 system ROS performed with pertinent positives and negatives included in the history, assessment and plan.  Included Systems: General, HEENT, Neck, Cardiovascular, Pulmonary, Gastrointestinal, Genitourinary, Musculoskeletal, Dermatologic, Endocrine, Hematological, Neurologic, Psychiatric Additional significant findings :  None   Exam: [facility] assistant Filed Vitals:   02/24/14 1004  BP: 120/70  Height: 5\' 5"  (1.651 m)  Weight: 133 lb (60.328 kg)   General appearance:  Normal affect, orientation and appearance. Skin: Grossly normal HEENT: Without gross lesions.  No cervical or supraclavicular adenopathy. Thyroid normal.  Lungs:  Clear without wheezing, rales or rhonchi Cardiac: RR, without RMG Abdominal:  Soft, nontender, without masses, guarding, rebound, organomegaly or hernia Breasts:  Examined lying and sitting without masses, retractions, discharge or axillary adenopathy. Pelvic:  Ext/BUS/vagina normal  Cervix normal  Uterus axial, normal size, shape and contour, midline and mobile nontender   Adnexa  Without masses or tenderness    Anus and perineum  Normal   Rectovaginal  Normal sphincter tone without palpated masses or tenderness.    Assessment/Plan:  48 y.o. G63P2002 female for annual exam with regular menses, oral contraceptives.   1. Birth control. Patient continues on Junel 1/20. Doing well with this and wants to continue. Reviewed increased risks of thrombosis to include stroke heart attack DVT. Never smoked and is not being followed for medical issues. Refill x1 year provided. 2. Pap smear 2013. No Pap smear done today. History of ASCUS with negative colposcopy 2009. Negative Pap  smears since then. Plan repeat Pap smear next year a 3 year interval. 3. Mammography 09/2013. Continue with annual mammography. SBE monthly reviewed. 4. Health maintenance. Baseline CBC comprehensive metabolic panel lipid profile urinalysis ordered. Follow up one year, sooner as needed.   Note: This document was prepared with digital dictation and possible smart phrase technology. Any transcriptional errors that result from this process are unintentional.   Dara Lords MD, 10:32 AM 02/24/2014

## 2014-02-25 LAB — URINALYSIS W MICROSCOPIC + REFLEX CULTURE
Bacteria, UA: NONE SEEN
Bilirubin Urine: NEGATIVE
CASTS: NONE SEEN
CRYSTALS: NONE SEEN
Glucose, UA: NEGATIVE mg/dL
Hgb urine dipstick: NEGATIVE
Ketones, ur: NEGATIVE mg/dL
LEUKOCYTES UA: NEGATIVE
NITRITE: NEGATIVE
PH: 7 (ref 5.0–8.0)
Protein, ur: NEGATIVE mg/dL
SPECIFIC GRAVITY, URINE: 1.011 (ref 1.005–1.030)
Urobilinogen, UA: 0.2 mg/dL (ref 0.0–1.0)

## 2014-07-27 ENCOUNTER — Encounter: Payer: Self-pay | Admitting: Gynecology

## 2015-01-30 ENCOUNTER — Other Ambulatory Visit: Payer: Self-pay | Admitting: Gynecology

## 2015-03-30 ENCOUNTER — Other Ambulatory Visit: Payer: Self-pay | Admitting: *Deleted

## 2015-03-30 MED ORDER — NORETHIN ACE-ETH ESTRAD-FE 1-20 MG-MCG PO TABS: 1.0000 | ORAL_TABLET | Freq: Every day | ORAL | Status: DC

## 2015-03-31 ENCOUNTER — Encounter: Payer: Self-pay | Admitting: Gynecology

## 2015-03-31 ENCOUNTER — Ambulatory Visit (INDEPENDENT_AMBULATORY_CARE_PROVIDER_SITE_OTHER): Payer: BLUE CROSS/BLUE SHIELD | Admitting: Gynecology

## 2015-03-31 VITALS — BP 116/74 | Ht 66.0 in | Wt 144.0 lb

## 2015-03-31 DIAGNOSIS — Z01419 Encounter for gynecological examination (general) (routine) without abnormal findings: Secondary | ICD-10-CM | POA: Diagnosis not present

## 2015-03-31 LAB — COMPREHENSIVE METABOLIC PANEL
ALK PHOS: 51 U/L (ref 39–117)
ALT: 10 U/L (ref 0–35)
AST: 14 U/L (ref 0–37)
Albumin: 4 g/dL (ref 3.5–5.2)
BILIRUBIN TOTAL: 0.5 mg/dL (ref 0.2–1.2)
BUN: 14 mg/dL (ref 6–23)
CO2: 26 meq/L (ref 19–32)
Calcium: 9.1 mg/dL (ref 8.4–10.5)
Chloride: 106 mEq/L (ref 96–112)
Creat: 0.83 mg/dL (ref 0.50–1.10)
GLUCOSE: 82 mg/dL (ref 70–99)
Potassium: 3.8 mEq/L (ref 3.5–5.3)
SODIUM: 139 meq/L (ref 135–145)
TOTAL PROTEIN: 6.4 g/dL (ref 6.0–8.3)

## 2015-03-31 LAB — CBC WITH DIFFERENTIAL/PLATELET
BASOS ABS: 0.1 10*3/uL (ref 0.0–0.1)
BASOS PCT: 1 % (ref 0–1)
EOS ABS: 0.1 10*3/uL (ref 0.0–0.7)
Eosinophils Relative: 1 % (ref 0–5)
HEMATOCRIT: 41.4 % (ref 36.0–46.0)
Hemoglobin: 13.7 g/dL (ref 12.0–15.0)
Lymphocytes Relative: 43 % (ref 12–46)
Lymphs Abs: 2.3 10*3/uL (ref 0.7–4.0)
MCH: 30 pg (ref 26.0–34.0)
MCHC: 33.1 g/dL (ref 30.0–36.0)
MCV: 90.8 fL (ref 78.0–100.0)
MONO ABS: 0.5 10*3/uL (ref 0.1–1.0)
MONOS PCT: 10 % (ref 3–12)
MPV: 10 fL (ref 8.6–12.4)
Neutro Abs: 2.4 10*3/uL (ref 1.7–7.7)
Neutrophils Relative %: 45 % (ref 43–77)
PLATELETS: 292 10*3/uL (ref 150–400)
RBC: 4.56 MIL/uL (ref 3.87–5.11)
RDW: 13.4 % (ref 11.5–15.5)
WBC: 5.3 10*3/uL (ref 4.0–10.5)

## 2015-03-31 LAB — LIPID PANEL
CHOLESTEROL: 205 mg/dL — AB (ref 0–200)
HDL: 54 mg/dL (ref >=46)
LDL Cholesterol: 136 mg/dL — ABNORMAL HIGH (ref 0–99)
Total CHOL/HDL Ratio: 3.8 Ratio
Triglycerides: 76 mg/dL (ref <150)
VLDL: 15 mg/dL (ref 0–40)

## 2015-03-31 MED ORDER — NORETHIN ACE-ETH ESTRAD-FE 1-20 MG-MCG PO TABS: 1.0000 | ORAL_TABLET | Freq: Every day | ORAL | Status: DC

## 2015-03-31 NOTE — Patient Instructions (Signed)
You may obtain a copy of any labs that were done today by logging onto MyChart as outlined in the instructions provided with your AVS (after visit summary). The office will not call with normal lab results but certainly if there are any significant abnormalities then we will contact you.   Health Maintenance, Female A healthy lifestyle and preventative care can promote health and wellness.  Maintain regular health, dental, and eye exams.  Eat a healthy diet. Foods like vegetables, fruits, whole grains, low-fat dairy products, and lean protein foods contain the nutrients you need without too many calories. Decrease your intake of foods high in solid fats, added sugars, and salt. Get information about a proper diet from your caregiver, if necessary.  Regular physical exercise is one of the most important things you can do for your health. Most adults should get at least 150 minutes of moderate-intensity exercise (any activity that increases your heart rate and causes you to sweat) each week. In addition, most adults need muscle-strengthening exercises on 2 or more days a week.   Maintain a healthy weight. The body mass index (BMI) is a screening tool to identify possible weight problems. It provides an estimate of body fat based on height and weight. Your caregiver can help determine your BMI, and can help you achieve or maintain a healthy weight. For adults 20 years and older:  A BMI below 18.5 is considered underweight.  A BMI of 18.5 to 24.9 is normal.  A BMI of 25 to 29.9 is considered overweight.  A BMI of 30 and above is considered obese.  Maintain normal blood lipids and cholesterol by exercising and minimizing your intake of saturated fat. Eat a balanced diet with plenty of fruits and vegetables. Blood tests for lipids and cholesterol should begin at age 62 and be repeated every 5 years. If your lipid or cholesterol levels are high, you are over 50, or you are a high risk for heart  disease, you may need your cholesterol levels checked more frequently.Ongoing high lipid and cholesterol levels should be treated with medicines if diet and exercise are not effective.  If you smoke, find out from your caregiver how to quit. If you do not use tobacco, do not start.  Lung cancer screening is recommended for adults aged 80 80 years who are at high risk for developing lung cancer because of a history of smoking. Yearly low-dose computed tomography (CT) is recommended for people who have at least a 30-pack-year history of smoking and are a current smoker or have quit within the past 15 years. A pack year of smoking is smoking an average of 1 pack of cigarettes a day for 1 year (for example: 1 pack a day for 30 years or 2 packs a day for 15 years). Yearly screening should continue until the smoker has stopped smoking for at least 15 years. Yearly screening should also be stopped for people who develop a health problem that would prevent them from having lung cancer treatment.  If you are pregnant, do not drink alcohol. If you are breastfeeding, be very cautious about drinking alcohol. If you are not pregnant and choose to drink alcohol, do not exceed 1 drink per day. One drink is considered to be 12 ounces (355 mL) of beer, 5 ounces (148 mL) of wine, or 1.5 ounces (44 mL) of liquor.  Avoid use of street drugs. Do not share needles with anyone. Ask for help if you need support or instructions about stopping  the use of drugs.  High blood pressure causes heart disease and increases the risk of stroke. Blood pressure should be checked at least every 1 to 2 years. Ongoing high blood pressure should be treated with medicines, if weight loss and exercise are not effective.  If you are 68 to 47 years old, ask your caregiver if you should take aspirin to prevent strokes.  Diabetes screening involves taking a blood sample to check your fasting blood sugar level. This should be done once every 3  years, after age 36, if you are within normal weight and without risk factors for diabetes. Testing should be considered at a younger age or be carried out more frequently if you are overweight and have at least 1 risk factor for diabetes.  Breast cancer screening is essential preventative care for women. You should practice "breast self-awareness." This means understanding the normal appearance and feel of your breasts and may include breast self-examination. Any changes detected, no matter how small, should be reported to a caregiver. Women in their 55s and 30s should have a clinical breast exam (CBE) by a caregiver as part of a regular health exam every 1 to 3 years. After age 39, women should have a CBE every year. Starting at age 68, women should consider having a mammogram (breast X-ray) every year. Women who have a family history of breast cancer should talk to their caregiver about genetic screening. Women at a high risk of breast cancer should talk to their caregiver about having an MRI and a mammogram every year.  Breast cancer gene (BRCA)-related cancer risk assessment is recommended for women who have family members with BRCA-related cancers. BRCA-related cancers include breast, ovarian, tubal, and peritoneal cancers. Having family members with these cancers may be associated with an increased risk for harmful changes (mutations) in the breast cancer genes BRCA1 and BRCA2. Results of the assessment will determine the need for genetic counseling and BRCA1 and BRCA2 testing.  The Pap test is a screening test for cervical cancer. Women should have a Pap test starting at age 65. Between ages 48 and 24, Pap tests should be repeated every 2 years. Beginning at age 7, you should have a Pap test every 3 years as long as the past 3 Pap tests have been normal. If you had a hysterectomy for a problem that was not cancer or a condition that could lead to cancer, then you no longer need Pap tests. If you are  between ages 24 and 33, and you have had normal Pap tests going back 10 years, you no longer need Pap tests. If you have had past treatment for cervical cancer or a condition that could lead to cancer, you need Pap tests and screening for cancer for at least 20 years after your treatment. If Pap tests have been discontinued, risk factors (such as a new sexual partner) need to be reassessed to determine if screening should be resumed. Some women have medical problems that increase the chance of getting cervical cancer. In these cases, your caregiver may recommend more frequent screening and Pap tests.  The human papillomavirus (HPV) test is an additional test that may be used for cervical cancer screening. The HPV test looks for the virus that can cause the cell changes on the cervix. The cells collected during the Pap test can be tested for HPV. The HPV test could be used to screen women aged 52 years and older, and should be used in women of any age  who have unclear Pap test results. After the age of 55, women should have HPV testing at the same frequency as a Pap test.  Colorectal cancer can be detected and often prevented. Most routine colorectal cancer screening begins at the age of 44 and continues through age 20. However, your caregiver may recommend screening at an earlier age if you have risk factors for colon cancer. On a yearly basis, your caregiver may provide home test kits to check for hidden blood in the stool. Use of a small camera at the end of a tube, to directly examine the colon (sigmoidoscopy or colonoscopy), can detect the earliest forms of colorectal cancer. Talk to your caregiver about this at age 86, when routine screening begins. Direct examination of the colon should be repeated every 5 to 10 years through age 13, unless early forms of pre-cancerous polyps or small growths are found.  Hepatitis C blood testing is recommended for all people born from [birth year] through 1965 and any  individual with known risks for hepatitis C.  Practice safe sex. Use condoms and avoid high-risk sexual practices to reduce the spread of sexually transmitted infections (STIs). Sexually active women aged 36 and younger should be checked for Chlamydia, which is a common sexually transmitted infection. Older women with new or multiple partners should also be tested for Chlamydia. Testing for other STIs is recommended if you are sexually active and at increased risk.  Osteoporosis is a disease in which the bones lose minerals and strength with aging. This can result in serious bone fractures. The risk of osteoporosis can be identified using a bone density scan. Women ages 20 and over and women at risk for fractures or osteoporosis should discuss screening with their caregivers. Ask your caregiver whether you should be taking a calcium supplement or vitamin D to reduce the rate of osteoporosis.  Menopause can be associated with physical symptoms and risks. Hormone replacement therapy is available to decrease symptoms and risks. You should talk to your caregiver about whether hormone replacement therapy is right for you.  Use sunscreen. Apply sunscreen liberally and repeatedly throughout the day. You should seek shade when your shadow is shorter than you. Protect yourself by wearing long sleeves, pants, a wide-brimmed hat, and sunglasses year round, whenever you are outdoors.  Notify your caregiver of new moles or changes in moles, especially if there is a change in shape or color. Also notify your caregiver if a mole is larger than the size of a pencil eraser.  Stay current with your immunizations. Document Released: 03/27/2011 Document Revised: 01/06/2013 Document Reviewed: 03/27/2011 [facility] Patient Information 2014 [employer].

## 2015-03-31 NOTE — Progress Notes (Signed)
Junie PanningMichelle S Dokes May 23, 1966 [id number]        49 y.o.  G2P2002 for annual exam.  Doing well without complaints.  Past medical history,surgical history, problem list, medications, allergies, family history and social history were all reviewed and documented as reviewed in the EPIC chart.  ROS:  Performed with pertinent positives and negatives included in the history, assessment and plan.   Additional significant findings :  none   Exam: [facility] personassistant Filed Vitals:   03/31/15 1010  BP: 116/74  Height: 5\' 6"  (1.676 m)  Weight: 144 lb (65.318 kg)   General appearance:  Normal affect, orientation and appearance. Skin: Grossly normal HEENT: Without gross lesions.  No cervical or supraclavicular adenopathy. Thyroid normal.  Lungs:  Clear without wheezing, rales or rhonchi Cardiac: RR, without RMG Abdominal:  Soft, nontender, without masses, guarding, rebound, organomegaly or hernia Breasts:  Examined lying and sitting without masses, retractions, discharge or axillary adenopathy. Pelvic:  Ext/BUS/vagina normal  Cervix normal  Uterus anteverted, normal size, shape and contour, midline and mobile nontender   Adnexa  Without masses or tenderness    Anus and perineum  Normal   Rectovaginal  Normal sphincter tone without palpated masses or tenderness.    Assessment/Plan:  49 y.o. 592P2002 female for annual exam with regular menses, oral contraceptives.   1. Oral contraceptives. Patient continues on Junel 1/20 doing well and wants to continue.  I again reviewed risks to include increased risk of stroke heart attack DVT associated with oral contraceptives. Possibly higher with older age. She never smoked and is not being followed for medical issues.  She accepts the risks and I refilled her 1 year.  2. Pap smear/HPV negative 2013.  The Pap smear done today. History of ASCUS with negative CMV 2009. Normal Pap smears since then. We'll plan repeat at 5 year interval per current screening  guidelines. 3. Mammography 09/2013. Patient missed her mammogram this past year. Is going to do this coming year. SBE monthly reviewed. 4. Health maintenance. Baseline CBC comprehensive metabolic panel lipid profile urinalysis ordered. Follow up in one year, sooner as needed.   Dara LordsFONTAINE,Geneveive Furness P MD, 10:33 AM 03/31/2015

## 2015-04-01 ENCOUNTER — Other Ambulatory Visit: Payer: Self-pay | Admitting: Gynecology

## 2015-04-01 DIAGNOSIS — E7800 Pure hypercholesterolemia, unspecified: Secondary | ICD-10-CM

## 2015-04-01 LAB — URINALYSIS W MICROSCOPIC + REFLEX CULTURE
Bilirubin Urine: NEGATIVE
CASTS: NONE SEEN
Crystals: NONE SEEN
Glucose, UA: NEGATIVE mg/dL
KETONES UR: NEGATIVE mg/dL
LEUKOCYTES UA: NEGATIVE
NITRITE: NEGATIVE
PH: 5 (ref 5.0–8.0)
Protein, ur: NEGATIVE mg/dL
SPECIFIC GRAVITY, URINE: 1.018 (ref 1.005–1.030)
Squamous Epithelial / LPF: NONE SEEN
Urobilinogen, UA: 0.2 mg/dL (ref 0.0–1.0)

## 2016-02-20 ENCOUNTER — Other Ambulatory Visit: Payer: Self-pay | Admitting: Gynecology

## 2016-04-27 ENCOUNTER — Ambulatory Visit (INDEPENDENT_AMBULATORY_CARE_PROVIDER_SITE_OTHER): Payer: BLUE CROSS/BLUE SHIELD | Admitting: Gynecology

## 2016-04-27 ENCOUNTER — Encounter: Payer: Self-pay | Admitting: Gynecology

## 2016-04-27 VITALS — BP 118/74 | Ht 65.5 in | Wt 148.0 lb

## 2016-04-27 DIAGNOSIS — Z01419 Encounter for gynecological examination (general) (routine) without abnormal findings: Secondary | ICD-10-CM | POA: Diagnosis not present

## 2016-04-27 DIAGNOSIS — Z13220 Encounter for screening for lipoid disorders: Secondary | ICD-10-CM | POA: Diagnosis not present

## 2016-04-27 LAB — COMPREHENSIVE METABOLIC PANEL
ALBUMIN: 3.9 g/dL (ref 3.6–5.1)
ALT: 10 U/L (ref 6–29)
AST: 16 U/L (ref 10–35)
Alkaline Phosphatase: 49 U/L (ref 33–115)
BUN: 15 mg/dL (ref 7–25)
CO2: 27 mmol/L (ref 20–31)
CREATININE: 0.94 mg/dL (ref 0.50–1.10)
Calcium: 9.1 mg/dL (ref 8.6–10.2)
Chloride: 102 mmol/L (ref 98–110)
Glucose, Bld: 79 mg/dL (ref 65–99)
Potassium: 4.2 mmol/L (ref 3.5–5.3)
SODIUM: 138 mmol/L (ref 135–146)
TOTAL PROTEIN: 6.2 g/dL (ref 6.1–8.1)
Total Bilirubin: 0.5 mg/dL (ref 0.2–1.2)

## 2016-04-27 LAB — CBC WITH DIFFERENTIAL/PLATELET
BASOS ABS: 0 {cells}/uL (ref 0–200)
Basophils Relative: 0 %
EOS PCT: 1 %
Eosinophils Absolute: 74 cells/uL (ref 15–500)
HCT: 41 % (ref 35.0–45.0)
Hemoglobin: 13.3 g/dL (ref 11.7–15.5)
Lymphocytes Relative: 39 %
Lymphs Abs: 2886 cells/uL (ref 850–3900)
MCH: 29.4 pg (ref 27.0–33.0)
MCHC: 32.4 g/dL (ref 32.0–36.0)
MCV: 90.7 fL (ref 80.0–100.0)
MPV: 10.2 fL (ref 7.5–12.5)
Monocytes Absolute: 518 cells/uL (ref 200–950)
Monocytes Relative: 7 %
NEUTROS ABS: 3922 {cells}/uL (ref 1500–7800)
Neutrophils Relative %: 53 %
PLATELETS: 261 10*3/uL (ref 140–400)
RBC: 4.52 MIL/uL (ref 3.80–5.10)
RDW: 12.8 % (ref 11.0–15.0)
WBC: 7.4 10*3/uL (ref 3.8–10.8)

## 2016-04-27 LAB — LIPID PANEL
CHOLESTEROL: 215 mg/dL — AB (ref 125–200)
HDL: 56 mg/dL (ref >=46)
LDL Cholesterol: 138 mg/dL — ABNORMAL HIGH (ref <130)
TRIGLYCERIDES: 103 mg/dL (ref <150)
Total CHOL/HDL Ratio: 3.8 Ratio (ref <=5.0)
VLDL: 21 mg/dL (ref <30)

## 2016-04-27 MED ORDER — NORETHIN ACE-ETH ESTRAD-FE 1-20 MG-MCG PO TABS: 1.0000 | ORAL_TABLET | Freq: Every day | ORAL | 12 refills | Status: DC

## 2016-04-27 NOTE — Patient Instructions (Signed)
Schedule your mammogram  You may obtain a copy of any labs that were done today by logging onto MyChart as outlined in the instructions provided with your AVS (after visit summary). The office will not call with normal lab results but certainly if there are any significant abnormalities then we will contact you.   Health Maintenance Adopting a healthy lifestyle and getting preventive care can go a long way to promote health and wellness. Talk with your health care provider about what schedule of regular examinations is right for you. This is a good chance for you to check in with your provider about disease prevention and staying healthy. In between checkups, there are plenty of things you can do on your own. Experts have done a lot of research about which lifestyle changes and preventive measures are most likely to keep you healthy. Ask your health care provider for more information. WEIGHT AND DIET  Eat a healthy diet  Be sure to include plenty of vegetables, fruits, low-fat dairy products, and lean protein.  Do not eat a lot of foods high in solid fats, added sugars, or salt.  Get regular exercise. This is one of the most important things you can do for your health.  Most adults should exercise for at least 150 minutes each week. The exercise should increase your heart rate and make you sweat (moderate-intensity exercise).  Most adults should also do strengthening exercises at least twice a week. This is in addition to the moderate-intensity exercise.  Maintain a healthy weight  Body mass index (BMI) is a measurement that can be used to identify possible weight problems. It estimates body fat based on height and weight. Your health care provider can help determine your BMI and help you achieve or maintain a healthy weight.  For females 20 years of age and older:   A BMI below 18.5 is considered underweight.  A BMI of 18.5 to 24.9 is normal.  A BMI of 25 to 29.9 is considered  overweight.  A BMI of 30 and above is considered obese.  Watch levels of cholesterol and blood lipids  You should start having your blood tested for lipids and cholesterol at 48 years of age, then have this test every 5 years.  You may need to have your cholesterol levels checked more often if:  Your lipid or cholesterol levels are high.  You are older than 48 years of age.  You are at high risk for heart disease.  CANCER SCREENING   Lung Cancer  Lung cancer screening is recommended for adults 55-80 years old who are at high risk for lung cancer because of a history of smoking.  A yearly low-dose CT scan of the lungs is recommended for people who:  Currently smoke.  Have quit within the past 15 years.  Have at least a 30-pack-year history of smoking. A pack year is smoking an average of one pack of cigarettes a day for 1 year.  Yearly screening should continue until it has been 15 years since you quit.  Yearly screening should stop if you develop a health problem that would prevent you from having lung cancer treatment.  Breast Cancer  Practice breast self-awareness. This means understanding how your breasts normally appear and feel.  It also means doing regular breast self-exams. Let your health care provider know about any changes, no matter how small.  If you are in your 20s or 30s, you should have a clinical breast exam (CBE) by a health   care provider every 1-3 years as part of a regular health exam.  If you are 76 or older, have a CBE every year. Also consider having a breast X-ray (mammogram) every year.  If you have a family history of breast cancer, talk to your health care provider about genetic screening.  If you are at high risk for breast cancer, talk to your health care provider about having an MRI and a mammogram every year.  Breast cancer gene (BRCA) assessment is recommended for women who have family members with BRCA-related cancers. BRCA-related  cancers include:  Breast.  Ovarian.  Tubal.  Peritoneal cancers.  Results of the assessment will determine the need for genetic counseling and BRCA1 and BRCA2 testing. Cervical Cancer Routine pelvic examinations to screen for cervical cancer are no longer recommended for nonpregnant women who are considered low risk for cancer of the pelvic organs (ovaries, uterus, and vagina) and who do not have symptoms. A pelvic examination may be necessary if you have symptoms including those associated with pelvic infections. Ask your health care provider if a screening pelvic exam is right for you.   The Pap test is the screening test for cervical cancer for women who are considered at risk.  If you had a hysterectomy for a problem that was not cancer or a condition that could lead to cancer, then you no longer need Pap tests.  If you are older than 65 years, and you have had normal Pap tests for the past 10 years, you no longer need to have Pap tests.  If you have had past treatment for cervical cancer or a condition that could lead to cancer, you need Pap tests and screening for cancer for at least 20 years after your treatment.  If you no longer get a Pap test, assess your risk factors if they change (such as having a new sexual partner). This can affect whether you should start being screened again.  Some women have medical problems that increase their chance of getting cervical cancer. If this is the case for you, your health care provider may recommend more frequent screening and Pap tests.  The human papillomavirus (HPV) test is another test that may be used for cervical cancer screening. The HPV test looks for the virus that can cause cell changes in the cervix. The cells collected during the Pap test can be tested for HPV.  The HPV test can be used to screen women 64 years of age and older. Getting tested for HPV can extend the interval between normal Pap tests from three to five  years.  An HPV test also should be used to screen women of any age who have unclear Pap test results.  After 48 years of age, women should have HPV testing as often as Pap tests.  Colorectal Cancer  This type of cancer can be detected and often prevented.  Routine colorectal cancer screening usually begins at 48 years of age and continues through 48 years of age.  Your health care provider may recommend screening at an earlier age if you have risk factors for colon cancer.  Your health care provider may also recommend using home test kits to check for hidden blood in the stool.  A small camera at the end of a tube can be used to examine your colon directly (sigmoidoscopy or colonoscopy). This is done to check for the earliest forms of colorectal cancer.  Routine screening usually begins at age 14.  Direct examination of the  colon should be repeated every 5-10 years through 48 years of age. However, you may need to be screened more often if early forms of precancerous polyps or small growths are found. Skin Cancer  Check your skin from head to toe regularly.  Tell your health care provider about any new moles or changes in moles, especially if there is a change in a mole's shape or color.  Also tell your health care provider if you have a mole that is larger than the size of a pencil eraser.  Always use sunscreen. Apply sunscreen liberally and repeatedly throughout the day.  Protect yourself by wearing long sleeves, pants, a wide-brimmed hat, and sunglasses whenever you are outside. HEART DISEASE, DIABETES, AND HIGH BLOOD PRESSURE   Have your blood pressure checked at least every 1-2 years. High blood pressure causes heart disease and increases the risk of stroke.  If you are between 62 years and 28 years old, ask your health care provider if you should take aspirin to prevent strokes.  Have regular diabetes screenings. This involves taking a blood sample to check your fasting  blood sugar level.  If you are at a normal weight and have a low risk for diabetes, have this test once every three years after 48 years of age.  If you are overweight and have a high risk for diabetes, consider being tested at a younger age or more often. PREVENTING INFECTION  Hepatitis B  If you have a higher risk for hepatitis B, you should be screened for this virus. You are considered at high risk for hepatitis B if:  You were born in a country where hepatitis B is common. Ask your health care provider which countries are considered high risk.  Your parents were born in a high-risk country, and you have not been immunized against hepatitis B (hepatitis B vaccine).  You have HIV or AIDS.  You use needles to inject street drugs.  You live with someone who has hepatitis B.  You have had sex with someone who has hepatitis B.  You get hemodialysis treatment.  You take certain medicines for conditions, including cancer, organ transplantation, and autoimmune conditions. Hepatitis C  Blood testing is recommended for:  Everyone born from [birth year] through 1963.  Anyone with known risk factors for hepatitis C. Sexually transmitted infections (STIs)  You should be screened for sexually transmitted infections (STIs) including gonorrhea and chlamydia if:  You are sexually active and are younger than 48 years of age.  You are older than 48 years of age and your health care provider tells you that you are at risk for this type of infection.  Your sexual activity has changed since you were last screened and you are at an increased risk for chlamydia or gonorrhea. Ask your health care provider if you are at risk.  If you do not have HIV, but are at risk, it may be recommended that you take a prescription medicine daily to prevent HIV infection. This is called pre-exposure prophylaxis (PrEP). You are considered at risk if:  You are sexually active and do not regularly use condoms or know  the HIV status of your partner(s).  You take drugs by injection.  You are sexually active with a partner who has HIV. Talk with your health care provider about whether you are at high risk of being infected with HIV. If you choose to begin PrEP, you should first be tested for HIV. You should then be tested every 3 months  for as long as you are taking PrEP.  PREGNANCY   If you are premenopausal and you may become pregnant, ask your health care provider about preconception counseling.  If you may become pregnant, take 400 to 800 micrograms (mcg) of folic acid every day.  If you want to prevent pregnancy, talk to your health care provider about birth control (contraception). OSTEOPOROSIS AND MENOPAUSE   Osteoporosis is a disease in which the bones lose minerals and strength with aging. This can result in serious bone fractures. Your risk for osteoporosis can be identified using a bone density scan.  If you are 65 years of age or older, or if you are at risk for osteoporosis and fractures, ask your health care provider if you should be screened.  Ask your health care provider whether you should take a calcium or vitamin D supplement to lower your risk for osteoporosis.  Menopause may have certain physical symptoms and risks.  Hormone replacement therapy may reduce some of these symptoms and risks. Talk to your health care provider about whether hormone replacement therapy is right for you.  HOME CARE INSTRUCTIONS   Schedule regular health, dental, and eye exams.  Stay current with your immunizations.   Do not use any tobacco products including cigarettes, chewing tobacco, or electronic cigarettes.  If you are pregnant, do not drink alcohol.  If you are breastfeeding, limit how much and how often you drink alcohol.  Limit alcohol intake to no more than 1 drink per day for nonpregnant women. One drink equals 12 ounces of beer, 5 ounces of wine, or 1 ounces of hard liquor.  Do not  use street drugs.  Do not share needles.  Ask your health care provider for help if you need support or information about quitting drugs.  Tell your health care provider if you often feel depressed.  Tell your health care provider if you have ever been abused or do not feel safe at home. Document Released: 03/27/2011 Document Revised: 01/26/2014 Document Reviewed: 08/13/2013 ExitCare Patient Information 2015 [employer]. This information is not intended to replace advice given to you by your health care provider. Make sure you discuss any questions you have with your health care provider.  

## 2016-04-27 NOTE — Progress Notes (Signed)
    Anna Mccullough 09-Dec-1965 [id number]        50 y.o.  G2P2002  for annual exam.  Several issues noted below.  Past medical history,surgical history, problem list, medications, allergies, family history and social history were all reviewed and documented as reviewed in the EPIC chart.  ROS:  Performed with pertinent positives and negatives included in the history, assessment and plan.   Additional significant findings :  None   Exam: Kennon Portela assistant Vitals:   04/27/16 1013  BP: 118/74  Weight: 148 lb (67.1 kg)  Height: 5' 5.5" (1.664 m)   Body mass index is 24.25 kg/m.  General appearance:  Normal affect, orientation and appearance. Skin: Grossly normal HEENT: Without gross lesions.  No cervical or supraclavicular adenopathy. Thyroid normal.  Lungs:  Clear without wheezing, rales or rhonchi Cardiac: RR, without RMG Abdominal:  Soft, nontender, without masses, guarding, rebound, organomegaly or hernia Breasts:  Examined lying and sitting without masses, retractions, discharge or axillary adenopathy. Pelvic:  Ext/BUS/Vagina normal  Cervix normal  Uterus anteverted, normal size, shape and contour, midline and mobile nontender   Adnexa without masses or tenderness    Anus and perineum normal   Rectovaginal normal sphincter tone without palpated masses or tenderness.    Assessment/Plan:  50 y.o. G12P2002 female for annual exam with regular menses, oral contraceptives.   1. Continues on Junel 1/20 doing well. We have discussed the issues of thrombosis to include stroke heart attack DVT. Possible increased risk with increased age. Not being followed for any medical issues. Active lifestyle. Patient wants to continue I refilled her 1 year. 2. Pap smear/HPV 2013. No Pap smear done today. History of ASCUS with negative colposcopy 2009. Normal Paps otherwise. Plan repeat Pap smear next year at 5 year interval per current screening guidelines. 3. Mammography 2015. Patient  reminded she is overdue and she agrees to call and schedule. SBE monthly reviewed. 4. Health maintenance. Baseline CBC, CMP, lipid profile, urinalysis ordered. Follow up 1 year, sooner as needed.   Dara Lords MD, 10:37 AM 04/27/2016

## 2016-04-28 LAB — URINALYSIS W MICROSCOPIC + REFLEX CULTURE
Bacteria, UA: NONE SEEN [HPF]
Bilirubin Urine: NEGATIVE
Casts: NONE SEEN [LPF]
Crystals: NONE SEEN [HPF]
GLUCOSE, UA: NEGATIVE
Hgb urine dipstick: NEGATIVE
Ketones, ur: NEGATIVE
LEUKOCYTES UA: NEGATIVE
Nitrite: NEGATIVE
Protein, ur: NEGATIVE
RBC / HPF: NONE SEEN RBC/HPF (ref <=2)
Specific Gravity, Urine: 1.019 (ref 1.001–1.035)
Squamous Epithelial / LPF: NONE SEEN [HPF] (ref <=5)
WBC, UA: NONE SEEN WBC/HPF (ref <=5)
YEAST: NONE SEEN [HPF]
pH: 6.5 (ref 5.0–8.0)

## 2016-05-24 ENCOUNTER — Other Ambulatory Visit: Payer: Self-pay | Admitting: Gynecology

## 2016-05-24 DIAGNOSIS — E7800 Pure hypercholesterolemia, unspecified: Secondary | ICD-10-CM

## 2016-05-24 DIAGNOSIS — Z0100 Encounter for examination of eyes and vision without abnormal findings: Secondary | ICD-10-CM | POA: Diagnosis not present

## 2016-05-30 DIAGNOSIS — D485 Neoplasm of uncertain behavior of skin: Secondary | ICD-10-CM | POA: Diagnosis not present

## 2016-05-30 DIAGNOSIS — C44319 Basal cell carcinoma of skin of other parts of face: Secondary | ICD-10-CM | POA: Diagnosis not present

## 2016-05-30 DIAGNOSIS — L570 Actinic keratosis: Secondary | ICD-10-CM | POA: Diagnosis not present

## 2016-06-20 DIAGNOSIS — C44319 Basal cell carcinoma of skin of other parts of face: Secondary | ICD-10-CM | POA: Diagnosis not present

## 2016-06-30 DIAGNOSIS — Z23 Encounter for immunization: Secondary | ICD-10-CM | POA: Diagnosis not present

## 2016-11-01 ENCOUNTER — Encounter: Payer: Self-pay | Admitting: Gynecology

## 2016-11-01 DIAGNOSIS — Z1231 Encounter for screening mammogram for malignant neoplasm of breast: Secondary | ICD-10-CM | POA: Diagnosis not present

## 2017-01-23 DIAGNOSIS — M25512 Pain in left shoulder: Secondary | ICD-10-CM | POA: Diagnosis not present

## 2017-01-29 DIAGNOSIS — L814 Other melanin hyperpigmentation: Secondary | ICD-10-CM | POA: Diagnosis not present

## 2017-01-29 DIAGNOSIS — L821 Other seborrheic keratosis: Secondary | ICD-10-CM | POA: Diagnosis not present

## 2017-01-29 DIAGNOSIS — L905 Scar conditions and fibrosis of skin: Secondary | ICD-10-CM | POA: Diagnosis not present

## 2017-04-14 ENCOUNTER — Other Ambulatory Visit: Payer: Self-pay | Admitting: Gynecology

## 2017-05-16 ENCOUNTER — Other Ambulatory Visit: Payer: Self-pay | Admitting: Gynecology

## 2017-05-17 NOTE — Telephone Encounter (Signed)
Annual on 06/15/17 

## 2017-06-11 ENCOUNTER — Other Ambulatory Visit: Payer: Self-pay | Admitting: Gynecology

## 2017-06-15 ENCOUNTER — Encounter: Payer: Self-pay | Admitting: Gynecology

## 2017-06-15 ENCOUNTER — Ambulatory Visit (INDEPENDENT_AMBULATORY_CARE_PROVIDER_SITE_OTHER): Payer: BLUE CROSS/BLUE SHIELD | Admitting: Gynecology

## 2017-06-15 VITALS — BP 116/70 | Ht 65.5 in | Wt 150.0 lb

## 2017-06-15 DIAGNOSIS — Z13220 Encounter for screening for lipoid disorders: Secondary | ICD-10-CM

## 2017-06-15 DIAGNOSIS — Z1151 Encounter for screening for human papillomavirus (HPV): Secondary | ICD-10-CM

## 2017-06-15 DIAGNOSIS — R87618 Other abnormal cytological findings on specimens from cervix uteri: Secondary | ICD-10-CM | POA: Diagnosis not present

## 2017-06-15 DIAGNOSIS — Z23 Encounter for immunization: Secondary | ICD-10-CM

## 2017-06-15 DIAGNOSIS — Z01419 Encounter for gynecological examination (general) (routine) without abnormal findings: Secondary | ICD-10-CM | POA: Diagnosis not present

## 2017-06-15 LAB — COMPREHENSIVE METABOLIC PANEL
AG RATIO: 1.6 (calc) (ref 1.0–2.5)
ALBUMIN MSPROF: 3.9 g/dL (ref 3.6–5.1)
ALT: 9 U/L (ref 6–29)
AST: 15 U/L (ref 10–35)
Alkaline phosphatase (APISO): 59 U/L (ref 33–115)
BILIRUBIN TOTAL: 0.4 mg/dL (ref 0.2–1.2)
BUN: 16 mg/dL (ref 7–25)
CHLORIDE: 104 mmol/L (ref 98–110)
CO2: 29 mmol/L (ref 20–32)
CREATININE: 1.03 mg/dL (ref 0.50–1.10)
Calcium: 9.1 mg/dL (ref 8.6–10.2)
GLUCOSE: 89 mg/dL (ref 65–99)
Globulin: 2.5 g/dL (calc) (ref 1.9–3.7)
Potassium: 4.2 mmol/L (ref 3.5–5.3)
Sodium: 139 mmol/L (ref 135–146)
TOTAL PROTEIN: 6.4 g/dL (ref 6.1–8.1)

## 2017-06-15 LAB — LIPID PANEL
CHOL/HDL RATIO: 3.9 (calc) (ref <5.0)
Cholesterol: 230 mg/dL — ABNORMAL HIGH (ref <200)
HDL: 59 mg/dL (ref >50)
LDL Cholesterol (Calc): 153 mg/dL (calc) — ABNORMAL HIGH
NON-HDL CHOLESTEROL (CALC): 171 mg/dL — AB (ref <130)
Triglycerides: 79 mg/dL (ref <150)

## 2017-06-15 LAB — CBC WITH DIFFERENTIAL/PLATELET
BASOS PCT: 0.9 %
Basophils Absolute: 50 cells/uL (ref 0–200)
EOS PCT: 1.6 %
Eosinophils Absolute: 90 cells/uL (ref 15–500)
HEMATOCRIT: 41 % (ref 35.0–45.0)
HEMOGLOBIN: 13.7 g/dL (ref 11.7–15.5)
LYMPHS ABS: 2229 {cells}/uL (ref 850–3900)
MCH: 29.4 pg (ref 27.0–33.0)
MCHC: 33.4 g/dL (ref 32.0–36.0)
MCV: 88 fL (ref 80.0–100.0)
MONOS PCT: 8.2 %
MPV: 10.8 fL (ref 7.5–12.5)
NEUTROS ABS: 2772 {cells}/uL (ref 1500–7800)
Neutrophils Relative %: 49.5 %
Platelets: 308 10*3/uL (ref 140–400)
RBC: 4.66 10*6/uL (ref 3.80–5.10)
RDW: 12 % (ref 11.0–15.0)
Total Lymphocyte: 39.8 %
WBC mixed population: 459 cells/uL (ref 200–950)
WBC: 5.6 10*3/uL (ref 3.8–10.8)

## 2017-06-15 MED ORDER — NORETHIN ACE-ETH ESTRAD-FE 1-20 MG-MCG PO TABS: 1.0000 | ORAL_TABLET | Freq: Every day | ORAL | 12 refills | Status: DC

## 2017-06-15 NOTE — Progress Notes (Signed)
    Anna Mccullough 09-13-1966 [id number]        51 y.o.  G2P2002 for annual gynecologic exam.  Doing well without complaints  Past medical history,surgical history, problem list, medications, allergies, family history and social history were all reviewed and documented as reviewed in the EPIC chart.  ROS:  Performed with pertinent positives and negatives included in the history, assessment and plan.   Additional significant findings :  None   Exam: Kennon Portela assistant Vitals:   06/15/17 0800  BP: 116/70  Weight: 150 lb (68 kg)  Height: 5' 5.5" (1.664 m)   Body mass index is 24.58 kg/m.  General appearance:  Normal affect, orientation and appearance. Skin: Grossly normal HEENT: Without gross lesions.  No cervical or supraclavicular adenopathy. Thyroid normal.  Lungs:  Clear without wheezing, rales or rhonchi Cardiac: RR, without RMG Abdominal:  Soft, nontender, without masses, guarding, rebound, organomegaly or hernia Breasts:  Examined lying and sitting without masses, retractions, discharge or axillary adenopathy. Pelvic:  Ext, BUS, Vagina: Normal  Cervix: Normal. Pap smear/HPV  Uterus: Anteverted, normal size, shape and contour, midline and mobile nontender   Adnexa: Without masses or tenderness    Anus and perineum: Normal   Rectovaginal: Normal sphincter tone without palpated masses or tenderness.    Assessment/Plan:  51 y.o. G85P2002 female for annual gynecologic exam with regular menses, oral contraceptives.   1. Junel 1/20, doing well and wants to continue. Refill 1 year provided. Again reviewed risks to include stroke heart attack DVT. Otherwise healthy and not being followed for any medical issues. Never smoked. 2. Mammography 10/2016. Breast exam normal today. Continue annual mammography when due. 3. Pap smear/HPV 2013. Pap smear/HPV today. History of ASCUS with negative colposcopy 2009. Pap smear is otherwise normal. 4. Health maintenance. Baseline CBC, CMP  and lipid profile ordered. Patient is fasting today. Follow up 1 year, sooner as needed.   Dara Lords MD, 8:22 AM 06/15/2017

## 2017-06-15 NOTE — Patient Instructions (Signed)
Followup in one year for annual exam, sooner as needed. 

## 2017-06-15 NOTE — Addendum Note (Signed)
Addended by: Dayna Barker on: 06/15/2017 09:13 AM   Modules accepted: Orders

## 2017-06-18 ENCOUNTER — Other Ambulatory Visit: Payer: Self-pay | Admitting: Gynecology

## 2017-06-18 DIAGNOSIS — E7800 Pure hypercholesterolemia, unspecified: Secondary | ICD-10-CM

## 2017-06-19 LAB — PAP IG AND HPV HIGH-RISK: HPV DNA HIGH RISK: NOT DETECTED

## 2017-07-10 ENCOUNTER — Other Ambulatory Visit: Payer: Self-pay | Admitting: Gynecology

## 2017-11-05 DIAGNOSIS — Z1231 Encounter for screening mammogram for malignant neoplasm of breast: Secondary | ICD-10-CM | POA: Diagnosis not present

## 2018-05-08 DIAGNOSIS — Z0100 Encounter for examination of eyes and vision without abnormal findings: Secondary | ICD-10-CM | POA: Diagnosis not present

## 2018-06-28 DIAGNOSIS — Z23 Encounter for immunization: Secondary | ICD-10-CM | POA: Diagnosis not present

## 2018-08-13 ENCOUNTER — Encounter: Payer: Self-pay | Admitting: Gynecology

## 2018-08-13 ENCOUNTER — Telehealth: Payer: Self-pay | Admitting: *Deleted

## 2018-08-13 ENCOUNTER — Ambulatory Visit (INDEPENDENT_AMBULATORY_CARE_PROVIDER_SITE_OTHER): Payer: BLUE CROSS/BLUE SHIELD | Admitting: Gynecology

## 2018-08-13 VITALS — BP 118/70 | Ht 66.0 in | Wt 158.0 lb

## 2018-08-13 DIAGNOSIS — Z01419 Encounter for gynecological examination (general) (routine) without abnormal findings: Secondary | ICD-10-CM

## 2018-08-13 DIAGNOSIS — Z13220 Encounter for screening for lipoid disorders: Secondary | ICD-10-CM | POA: Diagnosis not present

## 2018-08-13 MED ORDER — NORETHIN ACE-ETH ESTRAD-FE 1-20 MG-MCG PO TABS: 1.0000 | ORAL_TABLET | Freq: Every day | ORAL | 4 refills | Status: DC

## 2018-08-13 NOTE — Telephone Encounter (Signed)
-----   Message from Dara Lordsimothy P Fontaine, MD sent at 08/13/2018 11:53 AM EST ----- Please call and cancel her birth control pill prescription [facility] [address].  She changed her mind after I sent it through and I resent [facility] to [facility] but needs the [address] one canceled.  Thanks

## 2018-08-13 NOTE — Patient Instructions (Addendum)
Follow up in one year for annual exam  Consider scheduling your screening colonoscopy as you have turned 52 with:  [facility]   Address: [address], [place], [place] [address]  [phone]    or  [facility]  Address: [address], [place], [place] [address]  [phone]

## 2018-08-13 NOTE — Progress Notes (Signed)
    Anna PanningMichelle S Mccullough [birth year] [id number]        52 y.o.  [id number] for annual gynecologic exam.  Doing well without gynecologic complaints  Past medical history,surgical history, problem list, medications, allergies, family history and social history were all reviewed and documented as reviewed in the EPIC chart.  ROS:  Performed with pertinent positives and negatives included in the history, assessment and plan.   Additional significant findings : None   Exam: Kennon PortelaKim Gardner assistant Vitals:   08/13/18 1120  BP: 118/70  Weight: 158 lb (71.7 kg)  Height: 5\' 6"  (1.676 m)   Body mass index is 25.5 kg/m.  General appearance:  Normal affect, orientation and appearance. Skin: Grossly normal HEENT: Without gross lesions.  No cervical or supraclavicular adenopathy. Thyroid normal.  Lungs:  Clear without wheezing, rales or rhonchi Cardiac: RR, without RMG Abdominal:  Soft, nontender, without masses, guarding, rebound, organomegaly or hernia Breasts:  Examined lying and sitting without masses, retractions, discharge or axillary adenopathy. Pelvic:  Ext, BUS, Vagina: Normal  Cervix: Normal  Uterus: Anteverted, normal size, shape and contour, midline and mobile nontender   Adnexa: Without masses or tenderness    Anus and perineum: Normal   Rectovaginal: Normal sphincter tone without palpated masses or tenderness.    Assessment/Plan:  52 y.o. 612P2002 female for annual gynecologic exam with regular menses, oral contraceptives.   1. Oral contraceptives.  Continues on 1/20 equivalents.  Discussed options at age 52 to include stopping pills, back-up contraception and monitoring symptoms/menses, checking FSH during end of pill free week and consider stopping the pills of elevated, continuing oral contraceptives for another year to.  Risks to include thrombosis such as stroke heart attack DVT reviewed.  Patient's active with no medical issues and never smoked.  At this point she prefers to  continue on the pills.  Refill x1 year provided. 2. Mammography 10/2017.  Continue with annual mammography when due.  Breast exam normal today. 3. Pap smear/HPV 05/2017.  No Pap smear done today.  History of ASCUS with negative colposcopy 2009 with normal Pap smears afterwards.  Plan repeat Pap smear/HPV at 5-year interval per current screening guidelines. 4. Colonoscopy coming due as she has turned 52.  Names and numbers provided. 5. Health maintenance.  Baseline CBC, CMP and lipid profile ordered.  History of elevated cholesterol in the past.  Has not followed up with a primary physician.  Follow-up 1 year, sooner as needed.   Dara Lordsimothy P Deyonna Fitzsimmons MD, 12:06 PM 08/13/2018

## 2018-08-13 NOTE — Telephone Encounter (Signed)
Rx canceled

## 2018-08-14 ENCOUNTER — Encounter: Payer: Self-pay | Admitting: Gynecology

## 2018-08-14 LAB — CBC WITH DIFFERENTIAL/PLATELET
BASOS ABS: 48 {cells}/uL (ref 0–200)
Basophils Relative: 0.7 %
EOS ABS: 61 {cells}/uL (ref 15–500)
Eosinophils Relative: 0.9 %
HEMATOCRIT: 41.2 % (ref 35.0–45.0)
HEMOGLOBIN: 14 g/dL (ref 11.7–15.5)
LYMPHS ABS: 3148 {cells}/uL (ref 850–3900)
MCH: 30.2 pg (ref 27.0–33.0)
MCHC: 34 g/dL (ref 32.0–36.0)
MCV: 88.8 fL (ref 80.0–100.0)
MPV: 10.7 fL (ref 7.5–12.5)
Monocytes Relative: 6.9 %
NEUTROS ABS: 3074 {cells}/uL (ref 1500–7800)
NEUTROS PCT: 45.2 %
Platelets: 295 10*3/uL (ref 140–400)
RBC: 4.64 10*6/uL (ref 3.80–5.10)
RDW: 11.8 % (ref 11.0–15.0)
Total Lymphocyte: 46.3 %
WBC mixed population: 469 cells/uL (ref 200–950)
WBC: 6.8 10*3/uL (ref 3.8–10.8)

## 2018-08-14 LAB — COMPREHENSIVE METABOLIC PANEL
AG Ratio: 1.8 (calc) (ref 1.0–2.5)
ALBUMIN MSPROF: 4.1 g/dL (ref 3.6–5.1)
ALT: 9 U/L (ref 6–29)
AST: 15 U/L (ref 10–35)
Alkaline phosphatase (APISO): 61 U/L (ref 33–130)
BUN: 14 mg/dL (ref 7–25)
CO2: 26 mmol/L (ref 20–32)
Calcium: 8.9 mg/dL (ref 8.6–10.4)
Chloride: 103 mmol/L (ref 98–110)
Creat: 1.03 mg/dL (ref 0.50–1.05)
Globulin: 2.3 g/dL (calc) (ref 1.9–3.7)
Glucose, Bld: 75 mg/dL (ref 65–99)
Potassium: 4 mmol/L (ref 3.5–5.3)
Sodium: 139 mmol/L (ref 135–146)
TOTAL PROTEIN: 6.4 g/dL (ref 6.1–8.1)
Total Bilirubin: 0.4 mg/dL (ref 0.2–1.2)

## 2018-08-14 LAB — LIPID PANEL
Cholesterol: 239 mg/dL — ABNORMAL HIGH (ref <200)
HDL: 58 mg/dL (ref >50)
LDL CHOLESTEROL (CALC): 159 mg/dL — AB
Non-HDL Cholesterol (Calc): 181 mg/dL (calc) — ABNORMAL HIGH (ref <130)
TRIGLYCERIDES: 102 mg/dL (ref <150)
Total CHOL/HDL Ratio: 4.1 (calc) (ref <5.0)

## 2018-11-12 DIAGNOSIS — Z1231 Encounter for screening mammogram for malignant neoplasm of breast: Secondary | ICD-10-CM | POA: Diagnosis not present

## 2019-01-30 DIAGNOSIS — Z203 Contact with and (suspected) exposure to rabies: Secondary | ICD-10-CM | POA: Diagnosis not present

## 2019-01-30 DIAGNOSIS — Z23 Encounter for immunization: Secondary | ICD-10-CM | POA: Diagnosis not present

## 2019-01-30 DIAGNOSIS — Z79890 Hormone replacement therapy (postmenopausal): Secondary | ICD-10-CM | POA: Diagnosis not present

## 2019-01-30 DIAGNOSIS — I10 Essential (primary) hypertension: Secondary | ICD-10-CM | POA: Diagnosis not present

## 2019-02-02 DIAGNOSIS — Z203 Contact with and (suspected) exposure to rabies: Secondary | ICD-10-CM | POA: Insufficient documentation

## 2019-02-02 HISTORY — DX: Contact with and (suspected) exposure to rabies: Z20.3

## 2019-02-06 DIAGNOSIS — Z203 Contact with and (suspected) exposure to rabies: Secondary | ICD-10-CM | POA: Diagnosis not present

## 2019-02-06 DIAGNOSIS — Z23 Encounter for immunization: Secondary | ICD-10-CM | POA: Diagnosis not present

## 2019-02-13 DIAGNOSIS — Z23 Encounter for immunization: Secondary | ICD-10-CM | POA: Diagnosis not present

## 2019-02-13 DIAGNOSIS — Z203 Contact with and (suspected) exposure to rabies: Secondary | ICD-10-CM | POA: Diagnosis not present

## 2019-03-12 DIAGNOSIS — Z01419 Encounter for gynecological examination (general) (routine) without abnormal findings: Secondary | ICD-10-CM | POA: Diagnosis not present

## 2019-03-15 ENCOUNTER — Encounter: Payer: Self-pay | Admitting: Gynecology

## 2019-03-20 NOTE — Telephone Encounter (Signed)
Recommend ER visit.

## 2019-06-18 ENCOUNTER — Encounter: Payer: Self-pay | Admitting: Gynecology

## 2019-08-28 ENCOUNTER — Other Ambulatory Visit: Payer: Self-pay

## 2019-08-29 ENCOUNTER — Ambulatory Visit (INDEPENDENT_AMBULATORY_CARE_PROVIDER_SITE_OTHER): Payer: BC Managed Care – PPO | Admitting: Gynecology

## 2019-08-29 ENCOUNTER — Encounter: Payer: Self-pay | Admitting: Gynecology

## 2019-08-29 VITALS — BP 118/76 | Ht 65.5 in | Wt 164.0 lb

## 2019-08-29 DIAGNOSIS — Z01419 Encounter for gynecological examination (general) (routine) without abnormal findings: Secondary | ICD-10-CM | POA: Diagnosis not present

## 2019-08-29 DIAGNOSIS — Z13220 Encounter for screening for lipoid disorders: Secondary | ICD-10-CM | POA: Diagnosis not present

## 2019-08-29 MED ORDER — NORETHIN ACE-ETH ESTRAD-FE 1-20 MG-MCG PO TABS: 1.0000 | ORAL_TABLET | Freq: Every day | ORAL | 4 refills | Status: DC

## 2019-08-29 NOTE — Progress Notes (Signed)
    Anna Mccullough March 24, 1966 [id number]        53 y.o.  [id number] for annual gynecologic exam.  Without gynecologic complaints  Past medical history,surgical history, problem list, medications, allergies, family history and social history were all reviewed and documented as reviewed in the EPIC chart.  ROS:  Performed with pertinent positives and negatives included in the history, assessment and plan.   Additional significant findings : None   Exam: Caryn Bee assistant Vitals:   08/29/19 0849  BP: 118/76  Weight: 164 lb (74.4 kg)  Height: 5' 5.5" (1.664 m)   Body mass index is 26.88 kg/m.  General appearance:  Normal affect, orientation and appearance. Skin: Grossly normal HEENT: Without gross lesions.  No cervical or supraclavicular adenopathy. Thyroid normal.  Lungs:  Clear without wheezing, rales or rhonchi Cardiac: RR, without RMG Abdominal:  Soft, nontender, without masses, guarding, rebound, organomegaly or hernia Breasts:  Examined lying and sitting without masses, retractions, discharge or axillary adenopathy. Pelvic:  Ext, BUS, Vagina: Normal  Cervix: Normal  Uterus: Anteverted, normal size, shape and contour, midline and mobile nontender   Adnexa: Without masses or tenderness    Anus and perineum: Normal   Rectovaginal: Normal sphincter tone without palpated masses or tenderness.    Assessment/Plan:  52 y.o. G27P2002 female for annual gynecologic exam.  With regular menses, oral contraceptives  1. Oral contraceptives.  Continues on 1/20 equivalents.  We discussed options as she is 14 to include [place] checked during pill free week, stopping the pills with menstrual/symptom calendar and backup contraception versus continuing pills for another year to control potential irregular bleeding in the perimenopause and symptom development and then stop next year.  Risks to include thrombosis such as stroke heart attack DVT reviewed.  At this point the patient would like to  continue and plans to stop next year.  Refill x1 year provided. 2. Mammography 10/2018.  Continue with annual mammography when due.  Breast exam normal today. 3. Pap smear/HPV 2018.  No Pap smear done today.  History of ASCUS negative colposcopy 2009 with normal Pap smears since.  Plan repeat Pap smear/HPV at 5-year interval per current screening guidelines. 4. : Colonoscopy never.  Recommend baseline colonoscopy. 5. Health maintenance.  Baseline labs to include CBC CMP and lipid profile ordered.  Follow-up 1 year, sooner as needed.   Anastasio Auerbach MD, 9:23 AM 08/29/2019

## 2019-08-29 NOTE — Patient Instructions (Signed)
Schedule your colonoscopy with either:  [facility]   Address: [address], [place], [place] [address]  [phone]    or  [facility]  Address: [address], [place], [place] [address]  [phone]     Follow-up in 1 year for annual exam 

## 2019-08-30 LAB — COMPREHENSIVE METABOLIC PANEL
AG Ratio: 1.8 (calc) (ref 1.0–2.5)
ALT: 9 U/L (ref 6–29)
AST: 14 U/L (ref 10–35)
Albumin: 4.1 g/dL (ref 3.6–5.1)
Alkaline phosphatase (APISO): 57 U/L (ref 37–153)
BUN: 21 mg/dL (ref 7–25)
CO2: 25 mmol/L (ref 20–32)
Calcium: 8.9 mg/dL (ref 8.6–10.4)
Chloride: 102 mmol/L (ref 98–110)
Creat: 0.97 mg/dL (ref 0.50–1.05)
Globulin: 2.3 g/dL (calc) (ref 1.9–3.7)
Glucose, Bld: 83 mg/dL (ref 65–99)
Potassium: 4.1 mmol/L (ref 3.5–5.3)
Sodium: 138 mmol/L (ref 135–146)
Total Bilirubin: 0.4 mg/dL (ref 0.2–1.2)
Total Protein: 6.4 g/dL (ref 6.1–8.1)

## 2019-08-30 LAB — CBC WITH DIFFERENTIAL/PLATELET
Absolute Monocytes: 544 cells/uL (ref 200–950)
Basophils Absolute: 40 cells/uL (ref 0–200)
Basophils Relative: 0.5 %
Eosinophils Absolute: 144 cells/uL (ref 15–500)
Eosinophils Relative: 1.8 %
HCT: 41.5 % (ref 35.0–45.0)
Hemoglobin: 13.9 g/dL (ref 11.7–15.5)
Lymphs Abs: 2744 cells/uL (ref 850–3900)
MCH: 30.5 pg (ref 27.0–33.0)
MCHC: 33.5 g/dL (ref 32.0–36.0)
MCV: 91 fL (ref 80.0–100.0)
MPV: 10.6 fL (ref 7.5–12.5)
Monocytes Relative: 6.8 %
Neutro Abs: 4528 cells/uL (ref 1500–7800)
Neutrophils Relative %: 56.6 %
Platelets: 313 10*3/uL (ref 140–400)
RBC: 4.56 10*6/uL (ref 3.80–5.10)
RDW: 12 % (ref 11.0–15.0)
Total Lymphocyte: 34.3 %
WBC: 8 10*3/uL (ref 3.8–10.8)

## 2019-08-30 LAB — LIPID PANEL
Cholesterol: 238 mg/dL — ABNORMAL HIGH (ref <200)
HDL: 54 mg/dL (ref >=50)
LDL Cholesterol (Calc): 161 mg/dL (calc) — ABNORMAL HIGH
Non-HDL Cholesterol (Calc): 184 mg/dL (calc) — ABNORMAL HIGH (ref <130)
Total CHOL/HDL Ratio: 4.4 (calc) (ref <5.0)
Triglycerides: 113 mg/dL (ref <150)

## 2019-09-01 ENCOUNTER — Encounter: Payer: Self-pay | Admitting: Gynecology

## 2019-09-15 NOTE — Telephone Encounter (Signed)
Spoke with patient and read her the My Chart email from Dr. Loetta Rough.  She will call [facility] PCP for an appointment.

## 2019-10-08 DIAGNOSIS — M542 Cervicalgia: Secondary | ICD-10-CM | POA: Diagnosis not present

## 2019-10-08 DIAGNOSIS — M25512 Pain in left shoulder: Secondary | ICD-10-CM | POA: Diagnosis not present

## 2019-10-20 ENCOUNTER — Other Ambulatory Visit: Payer: Self-pay

## 2019-10-20 ENCOUNTER — Ambulatory Visit: Payer: Self-pay | Admitting: Family Medicine

## 2019-10-20 ENCOUNTER — Encounter: Payer: Self-pay | Admitting: Family Medicine

## 2019-10-20 ENCOUNTER — Ambulatory Visit: Payer: BC Managed Care – PPO | Admitting: Family Medicine

## 2019-10-20 VITALS — BP 138/78 | HR 106 | Temp 98.1°F | Resp 16 | Ht 66.0 in | Wt 163.0 lb

## 2019-10-20 DIAGNOSIS — K1370 Unspecified lesions of oral mucosa: Secondary | ICD-10-CM | POA: Diagnosis not present

## 2019-10-20 DIAGNOSIS — E663 Overweight: Secondary | ICD-10-CM | POA: Diagnosis not present

## 2019-10-20 DIAGNOSIS — E785 Hyperlipidemia, unspecified: Secondary | ICD-10-CM | POA: Insufficient documentation

## 2019-10-20 DIAGNOSIS — M5412 Radiculopathy, cervical region: Secondary | ICD-10-CM | POA: Diagnosis not present

## 2019-10-20 DIAGNOSIS — R21 Rash and other nonspecific skin eruption: Secondary | ICD-10-CM | POA: Diagnosis not present

## 2019-10-20 DIAGNOSIS — E782 Mixed hyperlipidemia: Secondary | ICD-10-CM

## 2019-10-20 DIAGNOSIS — Z7689 Persons encountering health services in other specified circumstances: Secondary | ICD-10-CM

## 2019-10-20 DIAGNOSIS — M25561 Pain in right knee: Secondary | ICD-10-CM

## 2019-10-20 MED ORDER — MELOXICAM 15 MG PO TABS: 15.0000 mg | ORAL_TABLET | Freq: Every day | ORAL | 1 refills | Status: DC

## 2019-10-20 MED ORDER — ATORVASTATIN CALCIUM 10 MG PO TABS: 10.0000 mg | ORAL_TABLET | Freq: Every day | ORAL | 3 refills | Status: DC

## 2019-10-20 NOTE — Patient Instructions (Addendum)
Pleasure meeting you today.  Stop krill oil>> can use fish oil (omega-3) Follow up in 3 months with provider to recheck cholesterol (fasting).  Mobic for knee and neck. Would talk to your ortho about both.   COVID-19 Vaccine Information can be found at: [email or web address] For questions related to vaccine distribution or appointments, please email [email or web address] or call [phone].   Mediterranean Diet A Mediterranean diet refers to food and lifestyle choices that are based on the traditions of countries located on the [employer]. This way of eating has been shown to help prevent certain conditions and improve outcomes for people who have chronic diseases, like kidney disease and heart disease. What are tips for following this plan? Lifestyle  Cook and eat meals together with your family, when possible.  Drink enough fluid to keep your urine clear or pale yellow.  Be physically active every day. This includes: ? Aerobic exercise like running or swimming. ? Leisure activities like gardening, walking, or housework.  Get 7-8 hours of sleep each night.  If recommended by your health care provider, drink red wine in moderation. This means 1 glass a day for nonpregnant women and 2 glasses a day for men. A glass of wine equals 5 oz (150 mL). Reading food labels   Check the serving size of packaged foods. For foods such as rice and pasta, the serving size refers to the amount of cooked product, not dry.  Check the total fat in packaged foods. Avoid foods that have saturated fat or trans fats.  Check the ingredients list for added sugars, such as corn syrup. Shopping  At the grocery store, buy most of your food from the areas near the walls of the store. This includes: ? Fresh fruits and vegetables (produce). ? Grains, beans, nuts, and seeds. Some of these may be available in unpackaged forms or large amounts (in  bulk). ? Fresh seafood. ? Poultry and eggs. ? Low-fat dairy products.  Buy whole ingredients instead of prepackaged foods.  Buy fresh fruits and vegetables in-season from local farmers markets.  Buy frozen fruits and vegetables in resealable bags.  If you do not have access to quality fresh seafood, buy precooked frozen shrimp or canned fish, such as tuna, salmon, or sardines.  Buy small amounts of raw or cooked vegetables, salads, or olives from the deli or salad bar at your store.  Stock your pantry so you always have certain foods on hand, such as olive oil, canned tuna, canned tomatoes, rice, pasta, and beans. Cooking  Cook foods with extra-virgin olive oil instead of using butter or other vegetable oils.  Have meat as a side dish, and have vegetables or grains as your main dish. This means having meat in small portions or adding small amounts of meat to foods like pasta or stew.  Use beans or vegetables instead of meat in common dishes like chili or lasagna.  Experiment with different cooking methods. Try roasting or broiling vegetables instead of steaming or sauteing them.  Add frozen vegetables to soups, stews, pasta, or rice.  Add nuts or seeds for added healthy fat at each meal. You can add these to yogurt, salads, or vegetable dishes.  Marinate fish or vegetables using olive oil, lemon juice, garlic, and fresh herbs. Meal planning   Plan to eat 1 vegetarian meal one day each week. Try to work up to 2 vegetarian meals, if possible.  Eat seafood 2 or more times a week.  Have healthy snacks readily available, such  as: ? Vegetable sticks with hummus. ? [place] yogurt. ? Fruit and nut trail mix.  Eat balanced meals throughout the week. This includes: ? Fruit: 2-3 servings a day ? Vegetables: 4-5 servings a day ? Low-fat dairy: 2 servings a day ? Fish, poultry, or lean meat: 1 serving a day ? Beans and legumes: 2 or more servings a week ? Nuts and seeds: 1-2  servings a day ? Whole grains: 6-8 servings a day ? Extra-virgin olive oil: 3-4 servings a day  Limit red meat and sweets to only a few servings a month What are my food choices?  Mediterranean diet ? Recommended  Grains: Whole-grain pasta. Brown rice. Bulgar wheat. Polenta. Couscous. Whole-wheat bread. Orpah Cobb.  Vegetables: Artichokes. Beets. Broccoli. Cabbage. Carrots. Eggplant. Green beans. Chard. Kale. Spinach. Onions. Leeks. Peas. Squash. Tomatoes. Peppers. Radishes.  Fruits: Apples. Apricots. Avocado. Berries. Bananas. Cherries. Dates. Figs. Grapes. Lemons. Melon. Oranges. Peaches. Plums. Pomegranate.  Meats and other protein foods: Beans. Almonds. Sunflower seeds. Pine nuts. Peanuts. Cod. Salmon. Scallops. Shrimp. Tuna. Tilapia. Clams. Oysters. Eggs.  Dairy: Low-fat milk. Cheese. Greek yogurt.  Beverages: Water. Red wine. Herbal tea.  Fats and oils: Extra virgin olive oil. Avocado oil. Grape seed oil.  Sweets and desserts: [place] yogurt with honey. Baked apples. Poached pears. Trail mix.  Seasoning and other foods: Basil. Cilantro. Coriander. Cumin. Mint. Parsley. Sage. Rosemary. Tarragon. Garlic. Oregano. Thyme. Pepper. Balsalmic vinegar. Tahini. Hummus. Tomato sauce. Olives. Mushrooms. ? Limit these  Grains: Prepackaged pasta or rice dishes. Prepackaged cereal with added sugar.  Vegetables: Deep fried potatoes (french fries).  Fruits: Fruit canned in syrup.  Meats and other protein foods: Beef. Pork. Lamb. Poultry with skin. Hot dogs. Tomasa Blase.  Dairy: Ice cream. Sour cream. Whole milk.  Beverages: Juice. Sugar-sweetened soft drinks. Beer. Liquor and spirits.  Fats and oils: Butter. Canola oil. Vegetable oil. Beef fat (tallow). Lard.  Sweets and desserts: Cookies. Cakes. Pies. Candy.  Seasoning and other foods: Mayonnaise. Premade sauces and marinades. The items listed may not be a complete list. Talk with your dietitian about what dietary choices are right  for you. Summary  The Mediterranean diet includes both food and lifestyle choices.  Eat a variety of fresh fruits and vegetables, beans, nuts, seeds, and whole grains.  Limit the amount of red meat and sweets that you eat.  Talk with your health care provider about whether it is safe for you to drink red wine in moderation. This means 1 glass a day for nonpregnant women and 2 glasses a day for men. A glass of wine equals 5 oz (150 mL). This information is not intended to replace advice given to you by your health care provider. Make sure you discuss any questions you have with your health care provider. Document Revised: 05/11/2016 Document Reviewed: 05/04/2016 Elsevier Patient Education  2020 [employer].   Please help [place] help you:  We are honored you have chosen [facility] for your Primary Care home. Below you will find basic instructions that you may need to access in the future. Please help [place] help you by reading the instructions, which cover many of the frequent questions we experience.   Prescription refills and request:  -In order to allow more efficient response time, please call your pharmacy for all refills. They will forward the request electronically to [place]. This allows for the quickest possible response. Request left on a nurse line can take longer to refill, since these are checked as time allows between  office patients and other phone calls.  - refill request can take up to 3-5 working days to complete.  - If request is sent electronically and request is appropiate, it is usually completed in 1-2 business days.  - all patients will need to be seen routinely for all chronic medical conditions requiring prescription medications (see follow-up below). If you are overdue for follow up on your condition, you will be asked to make an appointment and we will call in enough medication to cover you until your appointment (up to 30 days).  - all controlled substances will require  a face to face visit to request/refill.  - if you desire your prescriptions to go through a new pharmacy, and have an active script at original pharmacy, you will need to call your pharmacy and have scripts transferred to new pharmacy. This is completed between the pharmacy locations and not by your provider.    Results: If any images or labs were ordered, it can take up to 1 week to get results depending on the test ordered and the lab/facility running and resulting the test. - Normal or stable results, which do not need further discussion, may be released to your mychart immediately with attached note to you. A call may not be generated for normal results. Please make certain to sign up for mychart. If you have questions on how to activate your mychart you can call the front office.  - If your results need further discussion, our office will attempt to contact you via phone, and if unable to reach you after 2 attempts, we will release your abnormal result to your mychart with instructions.  - All results will be automatically released in mychart after 1 week.  - Your provider will provide you with explanation and instruction on all relevant material in your results. Please keep in mind, results and labs may appear confusing or abnormal to the untrained eye, but it does not mean they are actually abnormal for you personally. If you have any questions about your results that are not covered, or you desire more detailed explanation than what was provided, you should make an appointment with your provider to do so.   Our office handles many outgoing and incoming calls daily. If we have not contacted you within 1 week about your results, please check your mychart to see if there is a message first and if not, then contact our office.  In helping with this matter, you help decrease call volume, and therefore allow [place] to be able to respond to patients needs more efficiently.   Acute office visits (sick  visit):  An acute visit is intended for a new problem and are scheduled in shorter time slots to allow schedule openings for patients with new problems. This is the appropriate visit to discuss a new problem. Problems will not be addressed by phone call or Echart message. Appointment is needed if requesting treatment. In order to provide you with excellent quality medical care with proper time for you to explain your problem, have an exam and receive treatment with instructions, these appointments should be limited to one new problem per visit. If you experience a new problem, in which you desire to be addressed, please make an acute office visit, we save openings on the schedule to accommodate you. Please do not save your new problem for any other type of visit, let us take care of it properly and quickly for you.   Follow up visits:  Depending  on your condition(s) your provider will need to see you routinely in order to provide you with quality care and prescribe medication(s). Most chronic conditions (Example: hypertension, Diabetes, depression/anxiety... etc), require visits a couple times a year. Your provider will instruct you on proper follow up for your personal medical conditions and history. Please make certain to make follow up appointments for your condition as instructed. Failing to do so could result in lapse in your medication treatment/refills. If you request a refill, and are overdue to be seen on a condition, we will always provide you with a 30 day script (once) to allow you time to schedule.    Medicare wellness (well visit): - we have a wonderful Nurse Maudie Mercury), that will meet with you and provide you will yearly medicare wellness visits. These visits should occur yearly (can not be scheduled less than 1 calendar year apart) and cover preventive health, immunizations, advance directives and screenings you are entitled to yearly through your medicare benefits. Do not miss out on your  entitled benefits, this is when medicare will pay for these benefits to be ordered for you.  These are strongly encouraged by your provider and is the appropriate type of visit to make certain you are up to date with all preventive health benefits. If you have not had your medicare wellness exam in the last 12 months, please make certain to schedule one by calling the office and schedule your medicare wellness with Maudie Mercury as soon as possible.   Yearly physical (well visit):  - Adults are recommended to be seen yearly for physicals. Check with your insurance and date of your last physical, most insurances require one calendar year between physicals. Physicals include all preventive health topics, screenings, medical exam and labs that are appropriate for gender/age and history. You may have fasting labs needed at this visit. This is a well visit (not a sick visit), new problems should not be covered during this visit (see acute visit).  - Pediatric patients are seen more frequently when they are younger. Your provider will advise you on well child visit timing that is appropriate for your their age. - This is not a medicare wellness visit. Medicare wellness exams do not have an exam portion to the visit. Some medicare companies allow for a physical, some do not allow a yearly physical. If your medicare allows a yearly physical you can schedule the medicare wellness with our nurse Maudie Mercury and have your physical with your provider after, on the same day. Please check with insurance for your full benefits.   Late Policy/No Shows:  - all new patients should arrive 15-30 minutes earlier than appointment to allow [place] time  to  obtain all personal demographics,  insurance information and for you to complete office paperwork. - All established patients should arrive 10-15 minutes earlier than appointment time to update all information and be checked in .  - In our best efforts to run on time, if you are late for your  appointment you will be asked to either reschedule or if able, we will work you back into the schedule. There will be a wait time to work you back in the schedule,  depending on availability.  - If you are unable to make it to your appointment as scheduled, please call 24 hours ahead of time to allow [place] to fill the time slot with someone else who needs to be seen. If you do not cancel your appointment ahead of time, you may be  charged a no show fee.

## 2019-10-20 NOTE — Progress Notes (Signed)
Patient ID: Anna Mccullough, female  DOB: Feb 20, 1966, 54 y.o.   MRN: [id number] Patient Care Team    Relationship Specialty Notifications Start End  Ma Hillock, DO PCP - General Family Medicine  10/20/19   Anastasio Auerbach, MD  Gynecology  10/23/19   Latanya Maudlin, MD Consulting Physician Orthopedic Surgery  10/23/19   Sharyne Peach, MD Consulting Physician Ophthalmology  10/23/19     Chief Complaint  Patient presents with  . Establish Care    Pt has been using GYN as PCP. High Lipid panel and GYN told her to est with PCP.     Subjective:  Anna Mccullough is a 54 y.o.  female present for new patient establishment. All past medical history, surgical history, allergies, family history, immunizations, medications and social history were updated in the electronic medical record today. All recent labs, ED visits and hospitalizations within the last year were reviewed.  Hyperlipidemia: Pt has been seeing her gyn for yearly exams. Last two years of labs reviewed in EMR today. She has had elevated LDL last 2 years. Last year > 160 on 08/29/2019. She reports she is taking Krill oil started.  She is prescribed birth control pill hormone.  RF: HLD, overweight, fhx HD  She also complains of swelling in her right knee.  She states she fell in December on her knee and since that time it has been swollen and uncomfortable.  She has an orthopedic surgeon and is scheduling appointment with him.  She is wondering if there is anything she should do in the meantime.  Patient also complains of cervical radiculopathy along the left arm down into her hand.  She states she was seen by her orthopedic and provided with steroids in which she just finished.  However the pain continues.  Lipid Panel     Component Value Date/Time   CHOL 238 (H) 08/29/2019 0926   TRIG 113 08/29/2019 0926   HDL 54 08/29/2019 0926   CHOLHDL 4.4 08/29/2019 0926   VLDL 21 04/27/2016 1039   LDLCALC 161 (H)  08/29/2019 0926     Depression screen PHQ 2/9 10/20/2019  Decreased Interest 0  Down, Depressed, Hopeless 0  PHQ - 2 Score 0   No flowsheet data found.      No flowsheet data found.   Immunization History  Administered Date(s) Administered  . Influenza Inj Mdck Quad Pf 06/24/2019  . Influenza,inj,Quad PF,6+ Mos 06/15/2017  . Rabies, IM 01/30/2019, 01/30/2019, 02/02/2019, 02/06/2019, 02/13/2019    No exam data present  Past Medical History:  Diagnosis Date  . ASCUS on Pap smear 2009   C&B 2009-BENIGN  . Migraine   . Nephrolithiasis   . 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 BASAL CELL CARCINOMA EXCISION    . CESAREAN SECTION    . CESAREAN SECTION  1999  . CESAREAN SECTION  2004  . COLPOSCOPY  2009   B9  . KIDNEY SURGERY     REMOVAL OF KIDNEY STONES X 2  . KNEE SURGERY  1988   RIGHT KNEE SURGERY   Family History  Problem Relation Age of Onset  . Hypertension Mother   . Cancer Father        prostate  . Hypertension Father   . Heart attack Father   . Hyperlipidemia Father   . Kidney Stones Father   . Leukemia Paternal Grandfather   . Stroke Maternal Grandmother   .  Heart attack Maternal Grandfather    Social History   Social History Narrative   Marital status/children/pets: Married.  2 children.   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   Safety:      -smoke alarm in the home:Yes     - wears seatbelt: Yes     - Feels safe in their relationships: Yes    Allergies as of 10/20/2019   No Known Allergies     Medication List       Accurate as of October 20, 2019 11:59 PM. If you have any questions, ask your nurse or doctor.        STOP taking these medications   VITAMIN C PO Stopped by: Felix Pacini, DO     TAKE these medications   atorvastatin 10 MG tablet Commonly known as: LIPITOR Take 1 tablet (10 mg total) by mouth daily. Started by:  Felix Pacini, DO   ELDERBERRY PO Take by mouth.   EXCEDRIN PO Take by mouth.   KRILL OIL PO Take by mouth.   meloxicam 15 MG tablet Commonly known as: MOBIC Take 1 tablet (15 mg total) by mouth daily. Started by: Felix Pacini, DO   MULTI-VITAMIN PO Take by mouth.   norethindrone-ethinyl estradiol 1-20 MG-MCG tablet Commonly known as: Junel FE 1/20 Take 1 tablet by mouth daily.   ZYRTEC ALLERGY PO Take by mouth.       All past medical history, surgical history, allergies, family history, immunizations andmedications were updated in the EMR today and reviewed under the history and medication portions of their EMR.     No results found.   ROS: 14 pt review of systems performed and negative (unless mentioned in an HPI)  Objective: BP 138/78 (BP Location: Right Arm, Patient Position: Sitting, Cuff Size: Normal)   Pulse (!) 106   Temp 98.1 F (36.7 C) (Temporal)   Resp 16   Ht 5\' 6"  (1.676 m)   Wt 163 lb (73.9 kg)   LMP 09/24/2019 (Exact Date)   SpO2 97%   BMI 26.31 kg/m  Gen: Afebrile. No acute distress. Nontoxic in appearance, well-developed, well-nourished, pleasant Caucasian female. HENT: AT. [place].  Eyes:Pupils Equal Round Reactive to light, Extraocular movements intact,  Conjunctiva without redness, discharge or icterus. Neck/lymp/endocrine: Supple, no lymphadenopathy, no thyromegaly CV: RRR no murmur, no edema Chest: CTAB, no wheeze, rhonchi or crackles. Skin: X3 scars right kneecap, no purpura or petechiae. Warm and well-perfused. Skin intact. Neuro/Msk: Normal gait. PERLA. EOMi. Alert. Oriented x3.      -Right knee: No erythema.  3 scars on kneecap.  Mild to moderate effusion present.  Mild crepitus on extension.  Full extension capable.  Decreased range of motion in flexion.  No ligament laxity.  Neurovascular intact distally. Psych: Normal affect, dress and demeanor. Normal speech. Normal thought content and judgment.   Assessment/plan: Anna Mccullough is a 54 y.o. female present for establish care Mixed hyperlipidemia/Overweight (BMI 25.0-29.9) Options were discussed with her.  She is agreeable to start Lipitor 10 mg daily. Family history of heart disease. She was advised to stop krill oil with use of a statin. May start fish oil/omega-3 if desired. Follow-up in 3 months with provider to ensure tolerating statins and repeat lipids.  Cervical radiculopathy/Acute pain of right knee Encouraged her to follow-up on both her orthopedic conditions with her orthopedic surgeon. Discussed different options of anti-inflammatories with her today.  And she would like to try Mobic 15 mg  daily. Encouraged her to wear compression stocking on her right knee during all waking hours.  Keep elevated when able. Alternate heat/ice Follow-up with her orthopedic   Return in about 3 months (around 01/18/2020). No orders of the defined types were placed in this encounter.  Meds ordered this encounter  Medications  . atorvastatin (LIPITOR) 10 MG tablet    Sig: Take 1 tablet (10 mg total) by mouth daily.    Dispense:  90 tablet    Refill:  3  . meloxicam (MOBIC) 15 MG tablet    Sig: Take 1 tablet (15 mg total) by mouth daily.    Dispense:  90 tablet    Refill:  1   Referral Orders  No referral(s) requested today     Note is dictated utilizing voice recognition software. Although note has been proof read prior to signing, occasional typographical errors still can be missed. If any questions arise, please do not hesitate to call for verification.  Electronically signed by: Felix Pacini, DO [facility] [place]

## 2019-10-21 DIAGNOSIS — M25561 Pain in right knee: Secondary | ICD-10-CM | POA: Diagnosis not present

## 2019-10-23 ENCOUNTER — Encounter: Payer: Self-pay | Admitting: Family Medicine

## 2019-10-23 DIAGNOSIS — M25561 Pain in right knee: Secondary | ICD-10-CM

## 2019-10-23 DIAGNOSIS — M5412 Radiculopathy, cervical region: Secondary | ICD-10-CM

## 2019-10-23 HISTORY — DX: Pain in right knee: M25.561

## 2019-10-23 HISTORY — DX: Radiculopathy, cervical region: M54.12

## 2019-11-10 DIAGNOSIS — Z1231 Encounter for screening mammogram for malignant neoplasm of breast: Secondary | ICD-10-CM | POA: Diagnosis not present

## 2019-11-18 DIAGNOSIS — M542 Cervicalgia: Secondary | ICD-10-CM | POA: Diagnosis not present

## 2019-11-25 DIAGNOSIS — M542 Cervicalgia: Secondary | ICD-10-CM | POA: Diagnosis not present

## 2019-11-25 DIAGNOSIS — M25512 Pain in left shoulder: Secondary | ICD-10-CM | POA: Diagnosis not present

## 2019-12-08 DIAGNOSIS — M542 Cervicalgia: Secondary | ICD-10-CM | POA: Diagnosis not present

## 2019-12-11 DIAGNOSIS — M5412 Radiculopathy, cervical region: Secondary | ICD-10-CM | POA: Diagnosis not present

## 2019-12-17 DIAGNOSIS — M542 Cervicalgia: Secondary | ICD-10-CM | POA: Diagnosis not present

## 2020-01-19 ENCOUNTER — Ambulatory Visit: Payer: BC Managed Care – PPO | Admitting: Family Medicine

## 2020-01-19 ENCOUNTER — Other Ambulatory Visit: Payer: Self-pay

## 2020-01-19 ENCOUNTER — Encounter: Payer: Self-pay | Admitting: Family Medicine

## 2020-01-19 VITALS — BP 138/98 | HR 73 | Temp 97.9°F | Resp 17 | Ht 66.0 in | Wt 163.4 lb

## 2020-01-19 DIAGNOSIS — M5412 Radiculopathy, cervical region: Secondary | ICD-10-CM

## 2020-01-19 DIAGNOSIS — Z79899 Other long term (current) drug therapy: Secondary | ICD-10-CM | POA: Diagnosis not present

## 2020-01-19 DIAGNOSIS — E782 Mixed hyperlipidemia: Secondary | ICD-10-CM

## 2020-01-19 DIAGNOSIS — E663 Overweight: Secondary | ICD-10-CM

## 2020-01-19 DIAGNOSIS — Z5181 Encounter for therapeutic drug level monitoring: Secondary | ICD-10-CM

## 2020-01-19 DIAGNOSIS — Z23 Encounter for immunization: Secondary | ICD-10-CM

## 2020-01-19 DIAGNOSIS — M25561 Pain in right knee: Secondary | ICD-10-CM

## 2020-01-19 DIAGNOSIS — R252 Cramp and spasm: Secondary | ICD-10-CM

## 2020-01-19 DIAGNOSIS — M47812 Spondylosis without myelopathy or radiculopathy, cervical region: Secondary | ICD-10-CM

## 2020-01-19 LAB — COMPREHENSIVE METABOLIC PANEL
ALT: 13 U/L (ref 0–35)
AST: 15 U/L (ref 0–37)
Albumin: 4.3 g/dL (ref 3.5–5.2)
Alkaline Phosphatase: 82 U/L (ref 39–117)
BUN: 16 mg/dL (ref 6–23)
CO2: 32 mEq/L (ref 19–32)
Calcium: 9.6 mg/dL (ref 8.4–10.5)
Chloride: 100 mEq/L (ref 96–112)
Creatinine, Ser: 0.87 mg/dL (ref 0.40–1.20)
GFR: 68.47 mL/min (ref >60.00)
Glucose, Bld: 87 mg/dL (ref 70–99)
Potassium: 4.6 mEq/L (ref 3.5–5.1)
Sodium: 139 mEq/L (ref 135–145)
Total Bilirubin: 0.3 mg/dL (ref 0.2–1.2)
Total Protein: 6.7 g/dL (ref 6.0–8.3)

## 2020-01-19 LAB — LIPID PANEL
Cholesterol: 192 mg/dL (ref 0–200)
HDL: 56.7 mg/dL (ref >39.00)
LDL Cholesterol: 121 mg/dL — ABNORMAL HIGH (ref 0–99)
NonHDL: 135.78
Total CHOL/HDL Ratio: 3
Triglycerides: 74 mg/dL (ref 0.0–149.0)
VLDL: 14.8 mg/dL (ref 0.0–40.0)

## 2020-01-19 NOTE — Patient Instructions (Addendum)
Leg Cramps Leg cramps occur when one or more muscles tighten and you have no control over this tightening (involuntary muscle contraction). Muscle cramps can develop in any muscle, but the most common place is in the calf muscles of the leg. Those cramps can occur during exercise or when you are at rest. Leg cramps are painful, and they may last for a few seconds to a few minutes. Cramps may return several times before they finally stop. Usually, leg cramps are not caused by a serious medical problem. In many cases, the cause is not known. Some common causes include:  Excessive physical effort (overexertion), such as during intense exercise.  Overuse from repetitive motions, or doing the same thing over and over.  Staying in a certain position for a long period of time.  Improper preparation, form, or technique while performing a sport or an activity.  Dehydration.  Injury.  Side effects of certain medicines.  Abnormally low levels of minerals in your blood (electrolytes), especially potassium and calcium. This could result from: ? Pregnancy. ? Taking diuretic medicines. Follow these instructions at home: Eating and drinking  Drink enough fluid to keep your urine pale yellow. Staying hydrated may help prevent cramps.  Eat a healthy diet that includes plenty of nutrients to help your muscles function. A healthy diet includes fruits and vegetables, lean protein, whole grains, and low-fat or nonfat dairy products. Managing pain, stiffness, and swelling      Try massaging, stretching, and relaxing the affected muscle. Do this for several minutes at a time.  If directed, put ice on areas that are sore or painful after a cramp: ? Put ice in a plastic bag. ? Place a towel between your skin and the bag. ? Leave the ice on for 20 minutes, 2-3 times a day.  If directed, apply heat to muscles that are tense or tight. Do this before you exercise, or as often as told by your health care  provider. Use the heat source that your health care provider recommends, such as a moist heat pack or a heating pad. ? Place a towel between your skin and the heat source. ? Leave the heat on for 20-30 minutes. ? Remove the heat if your skin turns bright red. This is especially important if you are unable to feel pain, heat, or cold. You may have a greater risk of getting burned.  Try taking hot showers or baths to help relax tight muscles. General instructions  If you are having frequent leg cramps, avoid intense exercise for several days.  Take over-the-counter and prescription medicines only as told by your health care provider.  Keep all follow-up visits as told by your health care provider. This is important. Contact a health care provider if:  Your leg cramps get more severe or more frequent, or they do not improve over time.  Your foot becomes cold, numb, or blue. Summary  Muscle cramps can develop in any muscle, but the most common place is in the calf muscles of the leg.  Leg cramps are painful, and they may last for a few seconds to a few minutes.  Usually, leg cramps are not caused by a serious medical problem. Often, the cause is not known.  Stay hydrated and take over-the-counter and prescription medicines only as told by your health care provider. This information is not intended to replace advice given to you by your health care provider. Make sure you discuss any questions you have with your health care   provider. Document Revised: 08/24/2017 Document Reviewed: 06/21/2017 Elsevier Patient Education  2020 [employer] Inc[employer]  

## 2020-01-19 NOTE — Progress Notes (Signed)
Patient ID: Anna Mccullough, female  DOB: 09-27-1965, 54 y.o.   MRN: [id number] Patient Care Team    Relationship Specialty Notifications Start End  Ma Hillock, DO PCP - General Family Medicine  10/20/19   Anastasio Auerbach, MD  Gynecology  10/23/19   Latanya Maudlin, MD Consulting Physician Orthopedic Surgery  10/23/19   Sharyne Peach, MD Consulting Physician Ophthalmology  10/23/19     Chief Complaint  Patient presents with  . Hyperlipidemia    Fasting. Having some leg cramps, takes aleve, which does help     Subjective: Anna Mccullough is a 54 y.o.  female present for follow up after med start Hyperlipidemia: Patient was started on statin last visit secondary to elevated LDL.  She reports she is tolerating the statin well.  No reportable side effects. She is prescribed birth control pill hormone.  RF: HLD, overweight, fhx HD  Leg cramps:  Patient reports she is having leg cramps that occur mostly at night.  This is intermittent.  She takes Aleve when needed.  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  She reports she does drink a good deal of water throughout her shift.  Lipid Panel     Component Value Date/Time   CHOL 238 (H) 08/29/2019 0926   TRIG 113 08/29/2019 0926   HDL 54 08/29/2019 0926   CHOLHDL 4.4 08/29/2019 0926   VLDL 21 04/27/2016 1039   LDLCALC 161 (H) 08/29/2019 0926     Depression screen PHQ 2/9 10/20/2019  Decreased Interest 0  Down, Depressed, Hopeless 0  PHQ - 2 Score 0   No flowsheet data found.      No flowsheet data found.   Immunization History  Administered Date(s) Administered  . Influenza Inj Mdck Quad Pf 06/24/2019  . Influenza,inj,Quad PF,6+ Mos 06/15/2017  . PFIZER SARS-COV-2 Vaccination 10/23/2019, 11/14/2019  . Rabies, IM 01/30/2019, 01/30/2019, 02/02/2019, 02/06/2019, 02/13/2019    No exam data present  Past Medical History:  Diagnosis Date  . Acute pain of right knee 10/23/2019  . ASCUS on Pap smear 2009    C&B 2009-BENIGN  . Cervical radiculopathy 10/23/2019   c6-7 mild degenerative changes.   . Migraine   . Nephrolithiasis   . 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 BASAL CELL CARCINOMA EXCISION    . CESAREAN SECTION    . CESAREAN SECTION  1999  . CESAREAN SECTION  2004  . COLPOSCOPY  2009   B9  . KIDNEY SURGERY     REMOVAL OF KIDNEY STONES X 2  . KNEE SURGERY  1988   RIGHT KNEE SURGERY   Family History  Problem Relation Age of Onset  . Hypertension Mother   . Cancer Father        prostate  . Hypertension Father   . Heart attack Father   . Hyperlipidemia Father   . Kidney Stones Father   . Leukemia Paternal Grandfather   . Stroke Maternal Grandmother   . Heart attack Maternal Grandfather    Social History   Social History Narrative   Marital status/children/pets: Married.  2 children.   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   Safety:      -smoke alarm in the home:Yes     - wears seatbelt: Yes     - Feels safe in their relationships: Yes    Allergies as  of 01/19/2020   No Known Allergies     Medication List       Accurate as of January 19, 2020  8:37 AM. If you have any questions, ask your nurse or doctor.        STOP taking these medications   EXCEDRIN PO Stopped by: Howard Pouch, DO   meloxicam 15 MG tablet Commonly known as: MOBIC Stopped by: Howard Pouch, DO     TAKE these medications   atorvastatin 10 MG tablet Commonly known as: LIPITOR Take 1 tablet (10 mg total) by mouth daily.   ELDERBERRY PO Take by mouth.   KRILL OIL PO Take by mouth.   MULTI-VITAMIN PO Take by mouth.   norethindrone-ethinyl estradiol 1-20 MG-MCG tablet Commonly known as: Junel FE 1/20 Take 1 tablet by mouth daily.   ZYRTEC ALLERGY PO Take by mouth.       All past medical history, surgical history, allergies, family history, immunizations andmedications  were updated in the EMR today and reviewed under the history and medication portions of their EMR.     No results found.   ROS: 14 pt review of systems performed and negative (unless mentioned in an HPI)  Objective: BP (!) 138/98 (BP Location: Left Arm, Patient Position: Sitting, Cuff Size: Normal)   Pulse 73   Temp 97.9 F (36.6 C) (Temporal)   Resp 17   Ht 5' 6"  (1.676 m)   Wt 163 lb 6 oz (74.1 kg)   LMP  (Within Months)   SpO2 100%   BMI 26.37 kg/m  Gen: Afebrile. No acute distress.  HENT: AT. [place].  Eyes:Pupils Equal Round Reactive to light, Extraocular movements intact,  Conjunctiva without redness, discharge or icterus. CV: RRR no murmur, no edema, +2/4 P posterior tibialis pulses Chest: CTAB, no wheeze or crackles Neuro:  Normal gait. PERLA. EOMi. Alert. Oriented x3 Psych: Normal affect, dress and demeanor. Normal speech. Normal thought content and judgment.  Assessment/plan: Anna Mccullough is a 54 y.o. female present for establish care Mixed hyperlipidemia/Overweight (BMI 25.0-29.9) Blood pressure mildly elevated today-patient coming off night shift and she feels that is the cause of her elevated blood pressure.  Monitor. Continue  Lipitor 10 mg daily. Family history of heart disease. May start fish oil/omega-3 if desired. Lipids and cmp collected today Follow yearly with CPE  Cervical radiculopathy/Acute pain of right knee Improved- no longer using mobic and had an injection in her cervical spine through ortho  Leg cramps:  Okay to continue Aleve as needed. Encouraged hydration and stretches routinely to her calf muscles.  Possibly related to persistent position of sitting in car during her shifts.  Need for TDAP- immunization provided today   Return if symptoms worsen or fail to improve. Orders Placed This Encounter  Procedures  . Lipid panel  . Comp Met (CMET)   No orders of the defined types were placed in this encounter.  Referral Orders  No  referral(s) requested today     Note is dictated utilizing voice recognition software. Although note has been proof read prior to signing, occasional typographical errors still can be missed. If any questions arise, please do not hesitate to call for verification.  Electronically signed by: Howard Pouch, DO [place]

## 2020-03-09 ENCOUNTER — Ambulatory Visit: Payer: BC Managed Care – PPO | Admitting: Family Medicine

## 2020-03-09 ENCOUNTER — Other Ambulatory Visit: Payer: Self-pay

## 2020-03-09 VITALS — BP 134/88 | HR 65 | Temp 98.0°F | Resp 18 | Ht 66.0 in | Wt 162.5 lb

## 2020-03-09 DIAGNOSIS — H6982 Other specified disorders of Eustachian tube, left ear: Secondary | ICD-10-CM | POA: Diagnosis not present

## 2020-03-09 MED ORDER — FLUTICASONE PROPIONATE 50 MCG/ACT NA SUSP
2.0000 | Freq: Every day | NASAL | 6 refills | Status: DC
Start: 2020-03-09 — End: 2020-09-30

## 2020-03-09 NOTE — Progress Notes (Signed)
This visit occurred during the SARS-CoV-2 public health emergency.  Safety protocols were in place, including screening questions prior to the visit, additional usage of staff PPE, and extensive cleaning of exam room while observing appropriate contact time as indicated for disinfecting solutions.    Anna Mccullough , Sep 23, 1966, 54 y.o., female MRN: [id number] Patient Care Team    Relationship Specialty Notifications Start End  Ma Hillock, DO PCP - General Family Medicine  10/20/19   Anastasio Auerbach, MD (Inactive)  Gynecology  10/23/19   Latanya Maudlin, MD Consulting Physician Orthopedic Surgery  10/23/19   Sharyne Peach, MD Consulting Physician Ophthalmology  10/23/19     Chief Complaint  Patient presents with  . Ear Fullness    Started Sat night, ear pain and pressure in L ear.      Subjective: Pt presents for an OV with complaints of left-sided ear pain of 4 days duration.  Associated symptoms include pain and pressure.  Muffled sounding like there is water in her ear.  No ringing.  Denies fevers, chills, nausea, vomit or cough.  Noticing mild postnasal drip and drainage. She is taking Zyrtec daily for her allergies.  Depression screen PHQ 2/9 10/20/2019  Decreased Interest 0  Down, Depressed, Hopeless 0  PHQ - 2 Score 0    No Known Allergies Social History   Social History Narrative   Marital status/children/pets: Married.  2 children.   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   Safety:      -smoke alarm in the home:Yes     - wears seatbelt: Yes     - Feels safe in their relationships: Yes   Past Medical History:  Diagnosis Date  . Acute pain of right knee 10/23/2019  . ASCUS on Pap smear 2009   C&B 2009-BENIGN  . Cervical radiculopathy 10/23/2019   c6-7 mild degenerative changes.   . Migraine   . Nephrolithiasis   . 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 BASAL CELL  CARCINOMA EXCISION    . CESAREAN SECTION    . CESAREAN SECTION  1999  . CESAREAN SECTION  2004  . COLPOSCOPY  2009   B9  . KIDNEY SURGERY     REMOVAL OF KIDNEY STONES X 2  . KNEE SURGERY  1988   RIGHT KNEE SURGERY   Family History  Problem Relation Age of Onset  . Hypertension Mother   . Cancer Father        prostate  . Hypertension Father   . Heart attack Father   . Hyperlipidemia Father   . Kidney Stones Father   . Leukemia Paternal Grandfather   . Stroke Maternal Grandmother   . Heart attack Maternal Grandfather    Allergies as of 03/09/2020   No Known Allergies     Medication List       Accurate as of March 09, 2020 11:39 AM. If you have any questions, ask your nurse or doctor.        atorvastatin 10 MG tablet Commonly known as: LIPITOR Take 1 tablet (10 mg total) by mouth daily.   ELDERBERRY PO Take by mouth.   KRILL OIL PO Take by mouth.   MULTI-VITAMIN PO Take by mouth.   norethindrone-ethinyl estradiol 1-20 MG-MCG tablet Commonly known as: Junel FE 1/20 Take 1 tablet by mouth daily.   ZYRTEC ALLERGY PO Take by mouth.  All past medical history, surgical history, allergies, family history, immunizations andmedications were updated in the EMR today and reviewed under the history and medication portions of their EMR.     ROS: Negative, with the exception of above mentioned in HPI   Objective:  BP 134/88 (BP Location: Left Arm, Patient Position: Sitting, Cuff Size: Normal)   Pulse 65   Temp 98 F (36.7 C) (Temporal)   Resp 18   Ht 5\' 6"  (1.676 m)   Wt 162 lb 8 oz (73.7 kg)   LMP 03/07/2020   SpO2 98%   BMI 26.23 kg/m  Body mass index is 26.23 kg/m. Gen: Afebrile. No acute distress. Nontoxic in appearance, well developed, well nourished.  HENT: AT. [place]. Bilateral TM visualized right TM within normal limits.  Left TM without erythema, small cloudy fluid effusion present.  Normal external auditory canals bilaterally.. MMM, no oral  lesions. Bilateral nares without erythema, swelling or drainage.. Throat without erythema or exudates.  Small mount of postnasal drip present.  No cough.  No hoarseness. Eyes:Pupils Equal Round Reactive to light, Extraocular movements intact,  Conjunctiva without redness, discharge or icterus. Neck/lymp/endocrine: Supple, very mild but present left anterior cervical lymphadenopathy Neuro:  Normal gait. PERLA. EOMi. Alert. Oriented x3  Psych: Normal affect, dress and demeanor. Normal speech. Normal thought content and judgment.  No exam data present No results found. No results found for this or any previous visit (from the past 24 hour(s)).  Assessment/Plan: Anna Mccullough is a 54 y.o. female present for OV for  Eustachian tube dysfunction, left No signs of infection on exam today.  Small amount of effusion behind left TM membrane. Continue Zyrtec.  Consider switching to Xyzal. Start Flonase nasal spray-prescribed for her today. Follow-up as needed   Reviewed expectations re: course of current medical issues.  Discussed self-management of symptoms.  Outlined signs and symptoms indicating need for more acute intervention.  Patient verbalized understanding and all questions were answered.  Patient received an After-Visit Summary.    No orders of the defined types were placed in this encounter.  No orders of the defined types were placed in this encounter.  Referral Orders  No referral(s) requested today     Note is dictated utilizing voice recognition software. Although note has been proof read prior to signing, occasional typographical errors still can be missed. If any questions arise, please do not hesitate to call for verification.   electronically signed by:  44, DO  [facility] [place]

## 2020-03-09 NOTE — Patient Instructions (Signed)
Start flonase nasal spray daily.  Try Xyzal when your zyrtec runs out.   Eustachian Tube Dysfunction  Eustachian tube dysfunction refers to a condition in which a blockage develops in the narrow passage that connects the middle ear to the back of the nose (eustachian tube). The eustachian tube regulates air pressure in the middle ear by letting air move between the ear and nose. It also helps to drain fluid from the middle ear space. Eustachian tube dysfunction can affect one or both ears. When the eustachian tube does not function properly, air pressure, fluid, or both can build up in the middle ear. What are the causes? This condition occurs when the eustachian tube becomes blocked or cannot open normally. Common causes of this condition include:  Ear infections.  Colds and other infections that affect the nose, mouth, and throat (upper respiratory tract).  Allergies.  Irritation from cigarette smoke.  Irritation from stomach acid coming up into the esophagus (gastroesophageal reflux). The esophagus is the tube that carries food from the mouth to the stomach.  Sudden changes in air pressure, such as from descending in an airplane or scuba diving.  Abnormal growths in the nose or throat, such as: ? Growths that line the nose (nasal polyps). ? Abnormal growth of cells (tumors). ? Enlarged tissue at the back of the throat (adenoids). What increases the risk? You are more likely to develop this condition if:  You smoke.  You are overweight.  You are a child who has: ? Certain birth defects of the mouth, such as cleft palate. ? Large tonsils or adenoids. What are the signs or symptoms? Common symptoms of this condition include:  A feeling of fullness in the ear.  Ear pain.  Clicking or popping noises in the ear.  Ringing in the ear.  Hearing loss.  Loss of balance.  Dizziness. Symptoms may get worse when the air pressure around you changes, such as when you travel to  an area of high elevation, fly on an airplane, or go scuba diving. How is this diagnosed? This condition may be diagnosed based on:  Your symptoms.  A physical exam of your ears, nose, and throat.  Tests, such as those that measure: ? The movement of your eardrum (tympanogram). ? Your hearing (audiometry). How is this treated? Treatment depends on the cause and severity of your condition.  In mild cases, you may relieve your symptoms by moving air into your ears. This is called "popping the ears."  In more severe cases, or if you have symptoms of fluid in your ears, treatment may include: ? Medicines to relieve congestion (decongestants). ? Medicines that treat allergies (antihistamines). ? Nasal sprays or ear drops that contain medicines that reduce swelling (steroids). ? A procedure to drain the fluid in your eardrum (myringotomy). In this procedure, a small tube is placed in the eardrum to:  Drain the fluid.  Restore the air in the middle ear space. ? A procedure to insert a balloon device through the nose to inflate the opening of the eustachian tube (balloon dilation). Follow these instructions at home: Lifestyle  Do not do any of the following until your health care provider approves: ? Travel to high altitudes. ? Fly in airplanes. ? Work in a Pension scheme manager or room. ? Scuba dive.  Do not use any products that contain nicotine or tobacco, such as cigarettes and e-cigarettes. If you need help quitting, ask your health care provider.  Keep your ears dry. Wear fitted  earplugs during showering and bathing. Dry your ears completely after. General instructions  Take over-the-counter and prescription medicines only as told by your health care provider.  Use techniques to help pop your ears as recommended by your health care provider. These may include: ? Chewing gum. ? Yawning. ? Frequent, forceful swallowing. ? Closing your mouth, holding your nose closed, and gently  blowing as if you are trying to blow air out of your nose.  Keep all follow-up visits as told by your health care provider. This is important. Contact a health care provider if:  Your symptoms do not go away after treatment.  Your symptoms come back after treatment.  You are unable to pop your ears.  You have: ? A fever. ? Pain in your ear. ? Pain in your head or neck. ? Fluid draining from your ear.  Your hearing suddenly changes.  You become very dizzy.  You lose your balance. Summary  Eustachian tube dysfunction refers to a condition in which a blockage develops in the eustachian tube.  It can be caused by ear infections, allergies, inhaled irritants, or abnormal growths in the nose or throat.  Symptoms include ear pain, hearing loss, or ringing in the ears.  Mild cases are treated with maneuvers to unblock the ears, such as yawning or ear popping.  Severe cases are treated with medicines. Surgery may also be done (rare). This information is not intended to replace advice given to you by your health care provider. Make sure you discuss any questions you have with your health care provider. Document Revised: 01/01/2018 Document Reviewed: 01/01/2018 Elsevier Patient Education  2020 [employer].

## 2020-04-15 DIAGNOSIS — H5203 Hypermetropia, bilateral: Secondary | ICD-10-CM | POA: Diagnosis not present

## 2020-04-15 DIAGNOSIS — H524 Presbyopia: Secondary | ICD-10-CM | POA: Diagnosis not present

## 2020-09-22 ENCOUNTER — Other Ambulatory Visit: Payer: Self-pay | Admitting: *Deleted

## 2020-09-22 MED ORDER — NORETHIN ACE-ETH ESTRAD-FE 1-20 MG-MCG PO TABS: 1.0000 | ORAL_TABLET | Freq: Every day | ORAL | 0 refills | Status: DC

## 2020-09-22 NOTE — Telephone Encounter (Signed)
Annual exam scheduled on 09/30/20.

## 2020-09-30 ENCOUNTER — Encounter: Payer: Self-pay | Admitting: Obstetrics and Gynecology

## 2020-09-30 ENCOUNTER — Other Ambulatory Visit: Payer: Self-pay

## 2020-09-30 ENCOUNTER — Ambulatory Visit (INDEPENDENT_AMBULATORY_CARE_PROVIDER_SITE_OTHER): Payer: Managed Care, Other (non HMO) | Admitting: Obstetrics and Gynecology

## 2020-09-30 VITALS — BP 122/80 | Ht 66.0 in | Wt 158.0 lb

## 2020-09-30 DIAGNOSIS — N951 Menopausal and female climacteric states: Secondary | ICD-10-CM

## 2020-09-30 DIAGNOSIS — Z01419 Encounter for gynecological examination (general) (routine) without abnormal findings: Secondary | ICD-10-CM

## 2020-09-30 DIAGNOSIS — Z3009 Encounter for other general counseling and advice on contraception: Secondary | ICD-10-CM

## 2020-09-30 MED ORDER — NORETHIN ACE-ETH ESTRAD-FE 1-20 MG-MCG PO TABS: 1.0000 | ORAL_TABLET | Freq: Every day | ORAL | 3 refills | Status: DC

## 2020-09-30 NOTE — Progress Notes (Signed)
Anna Mccullough [birth year] [id number]  SUBJECTIVE:  55 y.o. G2P2002 female for annual routine gynecologic exam and Pap smear. She has no gynecologic concerns.  Continues to have light menses withdrawal week of oral contraceptive pills.  Current Outpatient Medications  Medication Sig Dispense Refill  . atorvastatin (LIPITOR) 10 MG tablet Take 1 tablet (10 mg total) by mouth daily. 90 tablet 3  . Multiple Vitamin (MULTI-VITAMIN PO) Take by mouth.    . norethindrone-ethinyl estradiol (JUNEL FE 1/20) 1-20 MG-MCG tablet Take 1 tablet by mouth daily. 84 tablet 0   No current facility-administered medications for this visit.   Allergies: Patient has no known allergies.  No LMP recorded. (Menstrual status: Irregular Periods).  Past medical history,surgical history, problem list, medications, allergies, family history and social history were all reviewed and documented as reviewed in the EPIC chart.  ROS: Pertinent positives and negatives as reviewed in HPI    OBJECTIVE:  BP 122/80 (BP Location: Right Arm, Patient Position: Sitting, Cuff Size: Normal)   Ht 5\' 6"  (1.676 m)   Wt 158 lb (71.7 kg)   BMI 25.50 kg/m  The patient appears well, alert, oriented, in no distress. ENT normal.  Neck supple. No cervical or supraclavicular adenopathy or thyromegaly.  Lungs are clear, good air entry, no wheezes, rhonchi or rales. S1 and S2 normal, no murmurs, regular rate and rhythm.  Abdomen soft without tenderness, guarding, mass or organomegaly.  Neurological is normal, no focal findings.  BREAST EXAM: breasts appear normal, no suspicious masses, no skin or nipple changes or axillary nodes  PELVIC EXAM: VULVA: normal appearing vulva with no masses, tenderness or lesions, VAGINA: normal appearing vagina with normal color and discharge, no lesions, CERVIX: normal appearing cervix without discharge or lesions, UTERUS: uterus is normal size, shape, consistency and nontender, ADNEXA: normal adnexa in  size, nontender and no masses  Chaperone: Bonham present during the examination  ASSESSMENT:  55 y.o. 46 here for annual gynecologic exam  PLAN:   1.  Perimenopausal.  Continues on 1/20 equivalent birth control pill.  Being on the police force, she does better with regularity of menstrual periods using the birth control pill and has concerns about perimenopausal irregularity if she were to discontinue.  We discussed that she may not have spontaneous periods if she were to stop the pill, or she may still have a few irregular periods from time to time being at a perimenopausal age.  She is not comfortable without uncertainty right now.  We discussed options to include continuing on the current regimen accepting the risks of thrombotic diseases (heart attack, stroke, DVT, PE) and the breast cancer issue versus discontinuing the pill and checking an [facility] and evaluating where she is at in the menopausal transition to see if she needs to continue with hormonal contraception, versus transitioning to a progestin only norethindrone minipill.  We discussed the pros and cons of each.  She would like to just continue on the current 1/20 equivalent birth control pill and will revisit next year if she is ready to transition off of that.  She is physically active especially with her job.  She understands the risks.  Refill of Junel 1/20 equivalent x1 year is sent to the pharmacy. 2. Pap smear/HPV 05/2017.  Prior ASCUS with negative possibly in 2009, normal Pap smears since then.  Next Pap smear due 2023 following the current guidelines recommending the 5 year interval. 3. Mammogram 10/2019.  Normal breast exam today.  She will  plan to schedule annual screening mammogram soon. 4. Colonoscopy never.  Recommended that she get a screening colonoscopy for colon cancer screening, or talk to her primary doctor about a Cologuard test if she is a candidate.  She acknowledges the recommendation. 5. Health  maintenance.  No labs today as she already had these completed with her primary care doctor who is managing hypercholesterolemia.  Return annually or sooner, prn.  Theresia Majors MD 09/30/20

## 2020-10-04 ENCOUNTER — Other Ambulatory Visit: Payer: Self-pay

## 2020-10-04 MED ORDER — ATORVASTATIN CALCIUM 10 MG PO TABS: 10.0000 mg | ORAL_TABLET | Freq: Every day | ORAL | 0 refills | Status: DC

## 2020-11-04 ENCOUNTER — Other Ambulatory Visit: Payer: Self-pay | Admitting: Family Medicine

## 2020-11-11 ENCOUNTER — Telehealth: Payer: Self-pay | Admitting: Family Medicine

## 2020-11-11 ENCOUNTER — Other Ambulatory Visit: Payer: Self-pay | Admitting: Family Medicine

## 2020-11-11 MED ORDER — ATORVASTATIN CALCIUM 10 MG PO TABS: 10.0000 mg | ORAL_TABLET | Freq: Every day | ORAL | 0 refills | Status: DC

## 2020-11-11 NOTE — Telephone Encounter (Signed)
Patient requesting refill of atorvastatin, has two pills left. She scheduled followup appt for 11/18/20.

## 2020-11-11 NOTE — Telephone Encounter (Signed)
Bridge sent

## 2020-11-18 ENCOUNTER — Encounter: Payer: Self-pay | Admitting: Family Medicine

## 2020-11-18 ENCOUNTER — Ambulatory Visit: Payer: Managed Care, Other (non HMO) | Admitting: Family Medicine

## 2020-11-18 ENCOUNTER — Other Ambulatory Visit: Payer: Self-pay

## 2020-11-18 VITALS — BP 139/86 | HR 88 | Temp 98.2°F | Ht 66.0 in | Wt 158.0 lb

## 2020-11-18 DIAGNOSIS — E782 Mixed hyperlipidemia: Secondary | ICD-10-CM

## 2020-11-18 DIAGNOSIS — Z0000 Encounter for general adult medical examination without abnormal findings: Secondary | ICD-10-CM | POA: Diagnosis not present

## 2020-11-18 DIAGNOSIS — Z114 Encounter for screening for human immunodeficiency virus [HIV]: Secondary | ICD-10-CM

## 2020-11-18 DIAGNOSIS — Z131 Encounter for screening for diabetes mellitus: Secondary | ICD-10-CM

## 2020-11-18 DIAGNOSIS — Z1159 Encounter for screening for other viral diseases: Secondary | ICD-10-CM

## 2020-11-18 LAB — COMPREHENSIVE METABOLIC PANEL
ALT: 10 U/L (ref 0–35)
AST: 16 U/L (ref 0–37)
Albumin: 4.1 g/dL (ref 3.5–5.2)
Alkaline Phosphatase: 62 U/L (ref 39–117)
BUN: 17 mg/dL (ref 6–23)
CO2: 31 mEq/L (ref 19–32)
Calcium: 9.5 mg/dL (ref 8.4–10.5)
Chloride: 101 mEq/L (ref 96–112)
Creatinine, Ser: 0.95 mg/dL (ref 0.40–1.20)
GFR: 68.9 mL/min (ref >60.00)
Glucose, Bld: 76 mg/dL (ref 70–99)
Potassium: 4.4 mEq/L (ref 3.5–5.1)
Sodium: 140 mEq/L (ref 135–145)
Total Bilirubin: 0.5 mg/dL (ref 0.2–1.2)
Total Protein: 6.7 g/dL (ref 6.0–8.3)

## 2020-11-18 LAB — CBC
HCT: 43.1 % (ref 36.0–46.0)
Hemoglobin: 14.4 g/dL (ref 12.0–15.0)
MCHC: 33.4 g/dL (ref 30.0–36.0)
MCV: 90.9 fl (ref 78.0–100.0)
Platelets: 273 10*3/uL (ref 150.0–400.0)
RBC: 4.74 Mil/uL (ref 3.87–5.11)
RDW: 12.9 % (ref 11.5–15.5)
WBC: 6.3 10*3/uL (ref 4.0–10.5)

## 2020-11-18 LAB — TSH: TSH: 1.47 u[IU]/mL (ref 0.35–4.50)

## 2020-11-18 LAB — LIPID PANEL
Cholesterol: 177 mg/dL (ref 0–200)
HDL: 59.3 mg/dL (ref >39.00)
LDL Cholesterol: 98 mg/dL (ref 0–99)
NonHDL: 117.26
Total CHOL/HDL Ratio: 3
Triglycerides: 97 mg/dL (ref 0.0–149.0)
VLDL: 19.4 mg/dL (ref 0.0–40.0)

## 2020-11-18 LAB — HEMOGLOBIN A1C: Hgb A1c MFr Bld: 5.9 % (ref 4.6–6.5)

## 2020-11-18 MED ORDER — ATORVASTATIN CALCIUM 10 MG PO TABS: 10.0000 mg | ORAL_TABLET | Freq: Every day | ORAL | 4 refills | Status: DC

## 2020-11-18 NOTE — Progress Notes (Signed)
This visit occurred during the SARS-CoV-2 public health emergency.  Safety protocols were in place, including screening questions prior to the visit, additional usage of staff PPE, and extensive cleaning of exam room while observing appropriate contact time as indicated for disinfecting solutions.    Patient ID: Anna Mccullough, female  DOB: Dec 22, 1965, 55 y.o.   MRN: [id number] Patient Care Team    Relationship Specialty Notifications Start End  Ma Hillock, DO PCP - General Family Medicine  10/20/19   Latanya Maudlin, MD Consulting Physician Orthopedic Surgery  10/23/19   Sharyne Peach, MD Consulting Physician Ophthalmology  10/23/19   Joseph Pierini, MD Consulting Physician Obstetrics and Gynecology  11/18/20   Vinnie Level, NP Nurse Practitioner Orthopedic Surgery  11/18/20     Chief Complaint  Patient presents with  . Follow-up    CMC; pt is not fasting    Subjective:  Anna Mccullough is a 55 y.o.  Female  present for CPE. All past medical history, surgical history, allergies, family history, immunizations, medications and social history were updated in the electronic medical record today. All recent labs, ED visits and hospitalizations within the last year were reviewed.  Health maintenance:  Colonoscopy: declined today- offered cologuard and declined as well (for now- she will think about it).  Mammogram: completed 10/2020 [facility].  Cervical cancer screening: last pap: 2022 Immunizations: tdap UTD 2021, Influenza UTD 2021(encouraged yearly), zostavax - declined for today- will call in and ok to schedule by nurse visit if desires, covid x3 Infectious disease screening: HIV and  Hep C > agreeable to testing today.  DeXA: routine screen Assistive device: none Oxygen FKC:LEXN Patient has a Dental home. Hospitalizations/ED visits: reviewed   Depression screen [facility] 2/9 11/18/2020 10/20/2019  Decreased Interest 0 0  Down, Depressed, Hopeless  0 0  PHQ - 2 Score 0 0   No flowsheet data found.   Immunization History  Administered Date(s) Administered  . Influenza Inj Mdck Quad Pf 06/24/2019  . Influenza,inj,Quad PF,6+ Mos 06/15/2017  . Influenza-Unspecified 07/24/2020  . PFIZER(Purple Top)SARS-COV-2 Vaccination 10/23/2019, 11/14/2019, 06/22/2020  . Rabies, IM 01/30/2019, 01/30/2019, 02/02/2019, 02/06/2019, 02/13/2019  . Tdap 01/19/2020    Past Medical History:  Diagnosis Date  . Acute pain of right knee 10/23/2019  . ASCUS on Pap smear 2009   C&B 2009-BENIGN  . Cervical radiculopathy 10/23/2019   c6-7 mild degenerative changes.   . Migraine   . Nephrolithiasis   . 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 BASAL CELL CARCINOMA EXCISION    . CESAREAN SECTION    . CESAREAN SECTION  1999  . CESAREAN SECTION  2004  . COLPOSCOPY  2009   B9  . KIDNEY SURGERY     REMOVAL OF KIDNEY STONES X 2  . KNEE SURGERY  1988   RIGHT KNEE SURGERY   Family History  Problem Relation Age of Onset  . Hypertension Mother   . Cancer Father        prostate  . Hypertension Father   . Heart attack Father   . Hyperlipidemia Father   . Kidney Stones Father   . Leukemia Paternal Grandfather   . Stroke Maternal Grandmother   . Heart attack Maternal Grandfather    Social History   Social History Narrative   Marital status/children/pets: Married.  2 children.   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  Safety:      -smoke alarm in the home:Yes     - wears seatbelt: Yes     - Feels safe in their relationships: Yes    Allergies as of 11/18/2020   No Known Allergies     Medication List       Accurate as of November 18, 2020  9:59 AM. If you have any questions, ask your nurse or doctor.        aspirin-acetaminophen-caffeine 250-250-65 MG tablet Commonly known as: EXCEDRIN MIGRAINE Take 1 tablet by mouth as needed.    atorvastatin 10 MG tablet Commonly known as: LIPITOR Take 1 tablet (10 mg total) by mouth daily.   b complex vitamins capsule Take 1 capsule by mouth daily.   cetirizine 10 MG tablet Commonly known as: ZYRTEC Take by mouth.   MULTI-VITAMIN PO Take by mouth.   norethindrone-ethinyl estradiol 1-20 MG-MCG tablet Commonly known as: Junel FE 1/20 Take 1 tablet by mouth daily.       All past medical history, surgical history, allergies, family history, immunizations andmedications were updated in the EMR today and reviewed under the history and medication portions of their EMR.     No results found for this or any previous visit (from the past 2160 hour(s)).  No results found.   ROS: 14 pt review of systems performed and negative (unless mentioned in an HPI)  Objective: BP 139/86   Pulse 88   Temp 98.2 F (36.8 C) (Oral)   Ht 5' 6"  (1.676 m)   Wt 158 lb (71.7 kg)   LMP 10/20/2020   SpO2 100%   BMI 25.50 kg/m  Gen: Afebrile. No acute distress. Nontoxic in appearance, well-developed, well-nourished,  Upset today.  female.  HENT: AT. [place]. Bilateral TM visualized and normal in appearance, normal external auditory canal. MMM, no oral lesions, adequate dentition. Bilateral nares within normal limits. Throat without erythema, ulcerations or exudates.  No cough on exam, no hoarseness on exam. Eyes:Pupils Equal Round Reactive to light, Extraocular movements intact,  Conjunctiva without redness, discharge or icterus. Neck/lymp/endocrine: Supple,no lymphadenopathy, no thyromegaly CV: RRR no murmur, no edema, +2/4 P posterior tibialis pulses.  Chest: CTAB, no wheeze, rhonchi or crackles.  Normal respiratory effort.  Good air movement. Abd: Soft.  Flat. NTND. BS present.  No masses palpated. No hepatosplenomegaly. No rebound tenderness or guarding. Skin: No rashes, purpura or petechiae. Warm and well-perfused. Skin intact. Neuro/Msk:  Normal gait. PERLA. EOMi. Alert. Oriented x3.   Cranial nerves II through XII intact. Muscle strength 5/5 upper/lower extremity. DTRs equal bilaterally. Psych: Normal affect, dress and demeanor. Normal speech. Normal thought content and judgment.   No exam data present  Assessment/plan: Anna Mccullough is a 55 y.o. female present for CPE  Mixed hyperlipidemia Continue atorvastatin 10 mg daily. - CBC - Comp Met (CMET) - Lipid panel - TSH Follow-up yearly with physicals Diabetes mellitus screening - Hemoglobin A1c Encounter for screening for HIV - HIV antibody (with reflex) Need for hepatitis C screening test - Hepatitis C Antibody  Routine general medical examination at a health care facility Patient was encouraged to exercise greater than 150 minutes a week. Patient was encouraged to choose a diet filled with fresh fruits and vegetables, and lean meats. AVS provided to patient today for education/recommendation on gender specific health and safety maintenance. Colonoscopy: declined today- offered cologuard and declined as well (for now- she will think about it).  Mammogram: completed 10/2020 [facility].  Cervical cancer  screening: last pap: 2022 Immunizations: tdap UTD 2021, Influenza UTD 2021(encouraged yearly), zostavax/Shingrix- declined for today- will call in and ok to schedule by nurse visit if desires, covid x3 completed Infectious disease screening: HIV and  Hep C > agreeable to testing today.  DeXA: routine screen  Return in about 1 year (around 11/18/2021) for CPE (30 min).  Orders Placed This Encounter  Procedures  . CBC  . Comp Met (CMET)  . Lipid panel  . TSH  . Hemoglobin A1c  . Hepatitis C Antibody  . HIV antibody (with reflex)   Meds ordered this encounter  Medications  . atorvastatin (LIPITOR) 10 MG tablet    Sig: Take 1 tablet (10 mg total) by mouth daily.    Dispense:  90 tablet    Refill:  4   Referral Orders  No referral(s) requested today     Electronically signed  by: Howard Pouch, [place]

## 2020-11-18 NOTE — Patient Instructions (Addendum)
Nice to see you today.  I have refilled yo r meds.  We will call you with lab results.  Next appt 1 yr physical.   If headache does not resolve, please make appt to be seen.   Health Maintenance, Female Adopting a healthy lifestyle and getting preventive care are important in promoting health and wellness. Ask your health care provider about:  The right schedule for you to have regular tests and exams.  Things you can do on your own to prevent diseases and keep yourself healthy. What should I know about diet, weight, and exercise? Eat a healthy diet  Eat a diet that includes plenty of vegetables, fruits, low-fat dairy products, and lean protein.  Do not eat a lot of foods that are high in solid fats, added sugars, or sodium.   Maintain a healthy weight Body mass index (BMI) is used to identify weight problems. It estimates body fat based on height and weight. Your health care provider can help determine your BMI and help you achieve or maintain a healthy weight. Get regular exercise Get regular exercise. This is one of the most important things you can do for your health. Most adults should:  Exercise for at least 150 minutes each week. The exercise should increase your heart rate and make you sweat (moderate-intensity exercise).  Do strengthening exercises at least twice a week. This is in addition to the moderate-intensity exercise.  Spend less time sitting. Even light physical activity can be beneficial. Watch cholesterol and blood lipids Have your blood tested for lipids and cholesterol at 53 years of age, then have this test every 5 years. Have your cholesterol levels checked more often if:  Your lipid or cholesterol levels are high.  You are older than 53 years of age.  You are at high risk for heart disease. What should I know about cancer screening? Depending on your health history and family history, you may need to have cancer screening at various ages. This may  include screening for:  Breast cancer.  Cervical cancer.  Colorectal cancer.  Skin cancer.  Lung cancer. What should I know about heart disease, diabetes, and high blood pressure? Blood pressure and heart disease  High blood pressure causes heart disease and increases the risk of stroke. This is more likely to develop in people who have high blood pressure readings, are of African descent, or are overweight.  Have your blood pressure checked: ? Every 3-5 years if you are 61-73 years of age. ? Every year if you are 70 years old or older. Diabetes Have regular diabetes screenings. This checks your fasting blood sugar level. Have the screening done:  Once every three years after age 18 if you are at a normal weight and have a low risk for diabetes.  More often and at a younger age if you are overweight or have a high risk for diabetes. What should I know about preventing infection? Hepatitis B If you have a higher risk for hepatitis B, you should be screened for this virus. Talk with your health care provider to find out if you are at risk for hepatitis B infection. Hepatitis C Testing is recommended for:  Everyone born from [birth year] through 1963.  Anyone with known risk factors for hepatitis C. Sexually transmitted infections (STIs)  Get screened for STIs, including gonorrhea and chlamydia, if: ? You are sexually active and are younger than 53 years of age. ? You are older than 53 years of age and your  health care provider tells you that you are at risk for this type of infection. ? Your sexual activity has changed since you were last screened, and you are at increased risk for chlamydia or gonorrhea. Ask your health care provider if you are at risk.  Ask your health care provider about whether you are at high risk for HIV. Your health care provider may recommend a prescription medicine to help prevent HIV infection. If you choose to take medicine to prevent HIV, you should first  get tested for HIV. You should then be tested every 3 months for as long as you are taking the medicine. Pregnancy  If you are about to stop having your period (premenopausal) and you may become pregnant, seek counseling before you get pregnant.  Take 400 to 800 micrograms (mcg) of folic acid every day if you become pregnant.  Ask for birth control (contraception) if you want to prevent pregnancy. Osteoporosis and menopause Osteoporosis is a disease in which the bones lose minerals and strength with aging. This can result in bone fractures. If you are 75 years old or older, or if you are at risk for osteoporosis and fractures, ask your health care provider if you should:  Be screened for bone loss.  Take a calcium or vitamin D supplement to lower your risk of fractures.  Be given hormone replacement therapy (HRT) to treat symptoms of menopause. Follow these instructions at home: Lifestyle  Do not use any products that contain nicotine or tobacco, such as cigarettes, e-cigarettes, and chewing tobacco. If you need help quitting, ask your health care provider.  Do not use street drugs.  Do not share needles.  Ask your health care provider for help if you need support or information about quitting drugs. Alcohol use  Do not drink alcohol if: ? Your health care provider tells you not to drink. ? You are pregnant, may be pregnant, or are planning to become pregnant.  If you drink alcohol: ? Limit how much you use to 0-1 drink a day. ? Limit intake if you are breastfeeding.  Be aware of how much alcohol is in your drink. In the [place] one drink equals one 12 oz bottle of beer (355 mL), one 5 oz glass of wine (148 mL), or one 1 oz glass of hard liquor (44 mL). General instructions  Schedule regular health, dental, and eye exams.  Stay current with your vaccines.  Tell your health care provider if: ? You often feel depressed. ? You have ever been abused or do not feel safe at  home. Summary  Adopting a healthy lifestyle and getting preventive care are important in promoting health and wellness.  Follow your health care provider's instructions about healthy diet, exercising, and getting tested or screened for diseases.  Follow your health care provider's instructions on monitoring your cholesterol and blood pressure. This information is not intended to replace advice given to you by your health care provider. Make sure you discuss any questions you have with your health care provider. Document Revised: 09/04/2018 Document Reviewed: 09/04/2018 Elsevier Patient Education  2021 [employer].

## 2020-11-19 LAB — HEPATITIS C ANTIBODY
Hepatitis C Ab: NONREACTIVE
SIGNAL TO CUT-OFF: 0.01 (ref <1.00)

## 2020-11-19 LAB — HIV ANTIBODY (ROUTINE TESTING W REFLEX): HIV 1&2 Ab, 4th Generation: NONREACTIVE

## 2021-06-17 ENCOUNTER — Telehealth: Payer: Self-pay | Admitting: Family Medicine

## 2021-06-17 NOTE — Telephone Encounter (Signed)
Form received. Pt will come by this afternoon for waist measurements

## 2021-06-17 NOTE — Telephone Encounter (Signed)
Pt dropped off wellness form to be filled out by DR [facility] for insurance. She needs to turn into her insurance by next Friday

## 2021-06-17 NOTE — Telephone Encounter (Signed)
Awaiting form

## 2021-07-11 ENCOUNTER — Other Ambulatory Visit: Payer: Self-pay

## 2021-07-11 ENCOUNTER — Encounter: Payer: Self-pay | Admitting: Family Medicine

## 2021-07-11 ENCOUNTER — Ambulatory Visit: Payer: Managed Care, Other (non HMO) | Admitting: Family Medicine

## 2021-07-11 VITALS — BP 134/76 | HR 85 | Temp 98.0°F | Ht 66.0 in | Wt 165.0 lb

## 2021-07-11 DIAGNOSIS — E663 Overweight: Secondary | ICD-10-CM | POA: Diagnosis not present

## 2021-07-11 DIAGNOSIS — Z23 Encounter for immunization: Secondary | ICD-10-CM

## 2021-07-11 DIAGNOSIS — Z713 Dietary counseling and surveillance: Secondary | ICD-10-CM | POA: Diagnosis not present

## 2021-07-11 NOTE — Patient Instructions (Signed)
  online resources for: Weekly net calorie calculator.  Calorie calculator.net Apps for calorie counting (My fittness pal).  Patient was advised to ensure she is  - adequate nutrition daily by meeting calorie goals. -dietary changes to not only lose weight but to eat healthy>>> LOW glycemic index. -exercise goal of 150 minutes a week (plus warm up and cool down) of cardiovascular exercise.   -Patient was encouraged to maintain adequate water consumption of at least 100 ounces a day, more if exercising/sweating.   Avoid 5 "evils" for now.

## 2021-07-11 NOTE — Progress Notes (Signed)
This visit occurred during the SARS-CoV-2 public health emergency.  Safety protocols were in place, including screening questions prior to the visit, additional usage of staff PPE, and extensive cleaning of exam room while observing appropriate contact time as indicated for disinfecting solutions.    Anna Mccullough , [birth year], 55 y.o., female MRN: [id number] Patient Care Team    Relationship Specialty Notifications Start End  Natalia Leatherwood, DO PCP - General Family Medicine  10/20/19   Ranee Gosselin, MD Consulting Physician Orthopedic Surgery  10/23/19   Elise Benne, MD Consulting Physician Ophthalmology  10/23/19   Theresia Majors, MD Consulting Physician Obstetrics and Gynecology  11/18/20   Malcolm Metro, NP Nurse Practitioner Orthopedic Surgery  11/18/20     Chief Complaint  Patient presents with   Weight Check     Subjective: Pt presents for an OV with complaints of weight. Start weight/date:165 pounds-07/11/2021 Body mass index is 26.63 kg/m.  Patient reports she has struggled with her weight.  She knows she is more sedentary now that her job is more desk oriented than active field duty.  She is a Emergency planning/management officer.  She reports she is very busy through the day and she rarely takes the time to eat a meal.  She does try to snack with healthy options during the day if needed.  She does not bring in her food log with her today as requested. For snacks at work she will grab fruit juice, veggie straws or skinny popcorn.  For breakfast she usually has coffee.  Dinner she shares with her family and is limited on some of her options by her family states.  She states they normally have a lean meats such as chicken, or lean pork.  Sometimes they will have fish.  And occasionally red meat.  They do enjoy Pasta, bread, rice.  They always attempt to have a large salad with dinner.  For vegetables she tends to use broccoli or green beans.  When eating fruits she eats melon  pineapple or grapes. She would like to get her weight back down to at least 145.  She states this is when she felt the best. She does not count her calories. She does not count her calories.  She does not exercise routinely secondary to Achilles and ankle discomfort.  She states she walks 2-3 times a week, but usually with the dogs and not for exercise. She is very unhappy with with the way her body is changing as she starts through menopause.   Depression screen [facility] 2/9 11/18/2020 10/20/2019  Decreased Interest 0 0  Down, Depressed, Hopeless 0 0  PHQ - 2 Score 0 0    No Known Allergies Social History   Social History Narrative   Marital status/children/pets: Married.  2 children.   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   Safety:      -smoke alarm in the home:Yes     - wears seatbelt: Yes     - Feels safe in their relationships: Yes   Past Medical History:  Diagnosis Date   Acute pain of right knee 10/23/2019   ASCUS on Pap smear 2009   C&B 2009-BENIGN   Cervical radiculopathy 10/23/2019   c6-7 mild degenerative changes.    Migraine    Nephrolithiasis    Rabies exposure 02/02/2019   Past Surgical History:  Procedure Laterality Date   BASAL CELL CARCINOMA EXCISION     CESAREAN SECTION  CESAREAN SECTION  1999   CESAREAN SECTION  2004   COLPOSCOPY  2009   B9   KIDNEY SURGERY     REMOVAL OF KIDNEY STONES X 2   KNEE SURGERY  1988   RIGHT KNEE SURGERY   Family History  Problem Relation Age of Onset   Hypertension Mother    Cancer Father        prostate   Hypertension Father    Heart attack Father    Hyperlipidemia Father    Kidney Stones Father    Leukemia Paternal Grandfather    Stroke Maternal Grandmother    Heart attack Maternal Grandfather    Allergies as of 07/11/2021   No Known Allergies      Medication List        Accurate as of July 11, 2021 11:59 PM. If you have any questions, ask your nurse or doctor.           STOP taking these medications    aspirin-acetaminophen-caffeine 250-250-65 MG tablet Commonly known as: EXCEDRIN MIGRAINE Stopped by: Felix Pacini, DO   MULTI-VITAMIN PO Stopped by: Felix Pacini, DO       TAKE these medications    atorvastatin 10 MG tablet Commonly known as: LIPITOR Take 1 tablet (10 mg total) by mouth daily.   b complex vitamins capsule Take 1 capsule by mouth daily.   cetirizine 10 MG tablet Commonly known as: ZYRTEC Take by mouth.   norethindrone-ethinyl estradiol-FE 1-20 MG-MCG tablet Commonly known as: Junel FE 1/20 Take 1 tablet by mouth daily.        All past medical history, surgical history, allergies, family history, immunizations andmedications were updated in the EMR today and reviewed under the history and medication portions of their EMR.     ROS: Negative, with the exception of above mentioned in HPI   Objective:  BP 134/76   Pulse 85   Temp 98 F (36.7 C) (Oral)   Ht 5\' 6"  (1.676 m)   Wt 165 lb (74.8 kg)   SpO2 99%   BMI 26.63 kg/m  Body mass index is 26.63 kg/m. Gen: Afebrile. No acute distress. Nontoxic in appearance, well developed, well nourished.  HENT: AT. [place].  Eyes:Pupils Equal Round Reactive to light, Extraocular movements intact,  Conjunctiva without redness, discharge or icterus.  Neuro:  Normal gait. Alert. Oriented x3  Psych: Normal affect, dress and demeanor. Normal speech. Normal thought content and judgment.  No results found. No results found. No results found for this or any previous visit (from the past 24 hour(s)).  Assessment/Plan: Anna Mccullough is a 55 y.o. female present for OV for  Overweight (BMI 25.0-29.9)/Weight loss counseling, encounter for Patient was counseled on exercise, calorie counting, weight loss. She is not much of a snacker, and therefore medication such as Depade or Wellbutrin would likely not benefit her much. -Patient was provided with online resources for:  Weekly net calorie calculator.  Applications for calorie counting.  Patient was advised to ensure she is taking in adequate nutrition daily by meeting calorie goals. Patient does not think she will be able to change her diet much.  She becomes upset when discussing the different options today and has anxiety surrounding have to change her meal routines.  Encouraged her to attempt to pull back on the complex carbohydrates and add an extra serving of vegetables to her dinner plate. She was encouraged to take healthy snacks such as a 07-28-2001 yogurt, raw vegetables etc. for snacking while  at work if she does not have time for a full meal. -Patient was educated on dietary changes to not only lose weight but to eat healthy.  Patient was educated on glycemic index. -Patient was educated on exercise goal of 150 minutes a week (plus warm up and cool down) of cardiovascular exercise.  Patient was educated on heart rate for cardiovascular and fat burning zones. -Patient will attempt to increase her exercise routine. -Patient was encouraged to maintain adequate water consumption of at least 100 ounces a day, more if exercising/sweating. Was encouraged to follow-up in 4 weeks if she is desiring further weight loss counseling/weight monitoring.  Influenza vaccine provided today  Reviewed expectations re: course of current medical issues. Discussed self-management of symptoms. Outlined signs and symptoms indicating need for more acute intervention. Patient verbalized understanding and all questions were answered. Patient received an After-Visit Summary.    Orders Placed This Encounter  Procedures   Flu Vaccine QUAD 6+ mos PF IM (Fluarix Quad PF)   No orders of the defined types were placed in this encounter.  Referral Orders  No referral(s) requested today     Note is dictated utilizing voice recognition software. Although note has been proof read prior to signing, occasional typographical errors still  can be missed. If any questions arise, please do not hesitate to call for verification.   electronically signed by:  Felix Pacini, DO  [facility] [place]

## 2021-07-15 ENCOUNTER — Encounter: Payer: Self-pay | Admitting: Family Medicine

## 2021-10-07 ENCOUNTER — Other Ambulatory Visit (HOSPITAL_COMMUNITY)
Admission: RE | Admit: 2021-10-07 | Discharge: 2021-10-07 | Disposition: A | Discharge status: Home / Self Care | Payer: Managed Care, Other (non HMO) | Source: Ambulatory Visit | Attending: Obstetrics and Gynecology | Admitting: Obstetrics and Gynecology

## 2021-10-07 ENCOUNTER — Other Ambulatory Visit: Payer: Self-pay

## 2021-10-07 ENCOUNTER — Ambulatory Visit (INDEPENDENT_AMBULATORY_CARE_PROVIDER_SITE_OTHER): Payer: Managed Care, Other (non HMO) | Admitting: Obstetrics and Gynecology

## 2021-10-07 ENCOUNTER — Encounter: Payer: Self-pay | Admitting: Obstetrics and Gynecology

## 2021-10-07 VITALS — BP 178/80 | HR 110 | Ht 64.0 in | Wt 160.0 lb

## 2021-10-07 DIAGNOSIS — Z01419 Encounter for gynecological examination (general) (routine) without abnormal findings: Secondary | ICD-10-CM

## 2021-10-07 DIAGNOSIS — Z124 Encounter for screening for malignant neoplasm of cervix: Secondary | ICD-10-CM | POA: Diagnosis present

## 2021-10-07 LAB — HM PAP SMEAR
HM Pap smear: NEGATIVE
HPV, high-risk: NEGATIVE

## 2021-10-07 LAB — RESULTS CONSOLE HPV: CHL HPV: NEGATIVE

## 2021-10-07 MED ORDER — NORETHINDRONE 0.35 MG PO TABS: 1.0000 | ORAL_TABLET | Freq: Every day | ORAL | 3 refills | Status: DC

## 2021-10-07 NOTE — Progress Notes (Signed)
56 y.o. G72P2002 Married Caucasian female here for annual exam.    Skips menses at times.  Light periods.  In the last year, she feels like she is having some hot flashes.   Some weight gain.  Is not receiving the help for this that she wants.   Stress at work. [employer]   PCP:  Felix Pacini, DO    Patient's last menstrual period was 08/31/2021 (approximate).     Period Pattern: (!) Irregular     Sexually active: No.  The current method of family planning is OCP (estrogen/progesterone).    Exercising: No.  The patient does not participate in regular exercise at present. Smoker:  no  Health Maintenance: Pap: 06-15-17 Neg:Neg HR HPV, 02-02-12 Neg:Neg HR HPV, 01-30-11 Neg History of abnormal Pap:  had one repeat pap due to an abnormal many years ago.  MMG:  11-16-20 Neg/BiRads2--[facility].  Colonoscopy:  NEVER  BMD:   N/A  Result  N/A TDaP:  01-19-20 Gardasil:   no HIV: 11-18-20 NR Hep C: 11-18-20 Neg Screening Labs:  PCP Flu vaccine:  completed. Covid:  one booster completed.     reports that she has never smoked. She has never used smokeless tobacco. She reports current alcohol use of about 1.0 standard drink per week. She reports that she does not use drugs.  Past Medical History:  Diagnosis Date   Acute pain of right knee 10/23/2019   ASCUS on Pap smear 2009   C&B 2009-BENIGN   Cervical radiculopathy 10/23/2019   c6-7 mild degenerative changes.    Migraine    Nephrolithiasis    Rabies exposure 02/02/2019    Past Surgical History:  Procedure Laterality Date   BASAL CELL CARCINOMA EXCISION     CESAREAN SECTION     CESAREAN SECTION  1999   CESAREAN SECTION  2004   COLPOSCOPY  2009   B9   KIDNEY SURGERY     REMOVAL OF KIDNEY STONES X 2   KNEE SURGERY  1988   RIGHT KNEE SURGERY    Current Outpatient Medications  Medication Sig Dispense Refill   atorvastatin (LIPITOR) 10 MG tablet Take 1 tablet (10 mg total) by mouth daily. 90 tablet 4   b complex  vitamins capsule Take 1 capsule by mouth daily.     cetirizine (ZYRTEC) 10 MG tablet Take by mouth.     norethindrone-ethinyl estradiol (JUNEL FE 1/20) 1-20 MG-MCG tablet Take 1 tablet by mouth daily. 84 tablet 3   No current facility-administered medications for this visit.    Family History  Problem Relation Age of Onset   Hypertension Mother    Cancer Father        prostate   Hypertension Father    Heart attack Father    Hyperlipidemia Father    Kidney Stones Father    Leukemia Paternal Grandfather    Stroke Maternal Grandmother    Heart attack Maternal Grandfather     Review of Systems  All other systems reviewed and are negative.  Exam:   BP (!) 178/80    Pulse (!) 110    Ht 5\' 4"  (1.626 m)    Wt 160 lb (72.6 kg)    LMP 08/31/2021 (Approximate)    SpO2 97%    BMI 27.46 kg/m    Repeat BP 150/87 General appearance: alert, cooperative and appears stated age Head: normocephalic, without obvious abnormality, atraumatic Neck: no adenopathy, supple, symmetrical, trachea midline and thyroid normal to inspection and palpation Lungs:  clear to auscultation bilaterally Breasts: normal appearance, no masses or tenderness, No nipple retraction or dimpling, No nipple discharge or bleeding, No axillary adenopathy Heart: regular rate and rhythm Abdomen: soft, non-tender; no masses, no organomegaly Extremities: extremities normal, atraumatic, no cyanosis or edema Skin: skin color, texture, turgor normal. No rashes or lesions Lymph nodes: cervical, supraclavicular, and axillary nodes normal. Neurologic: grossly normal  Pelvic: External genitalia:  no lesions              No abnormal inguinal nodes palpated.              Urethra:  normal appearing urethra with no masses, tenderness or lesions              Bartholins and Skenes: normal                 Vagina: normal appearing vagina with normal color and discharge, no lesions              Cervix: no lesions              Pap taken:  yes Bimanual Exam:  Uterus:  normal size, contour, position, consistency, mobility, non-tender              Adnexa: no mass, fullness, tenderness              Rectal exam: yes.  Confirms.              Anus:  normal sphincter tone, no lesions  Chaperone was present for exam:  Marchelle Folks, CMA  Assessment:   Well woman visit with gynecologic exam.  Perimenopausal female.  On combined oral contraception.  Elevated blood pressure reading.  Weight gain.   Plan: Mammogram screening discussed. Self breast awareness reviewed. Pap and HR HPV as above. Guidelines for Calcium, Vitamin D, regular exercise program including cardiovascular and weight bearing exercise. I recommended stopping her combined oral contraception due to her elevated blood pressure.  Will try Micronor.  Rx for 12 months.   If she starts having a lot of hot flashes, it would be beneficial to stop all hormones and then come in for an [facility] and estradiol check so we can potentially start HRT or another treatment for vasomotor symptoms. Weight loss through nutrition counseling, Weight watchers, and Medical Weight management reviewed.  She wants to do this on her own at this point.  Routine labs and TSH with PCP next month.  Follow up annually and prn.   After visit summary provided.

## 2021-10-07 NOTE — Patient Instructions (Signed)

## 2021-10-10 LAB — CYTOLOGY - PAP
Comment: NEGATIVE
Diagnosis: NEGATIVE
High risk HPV: NEGATIVE

## 2021-10-17 ENCOUNTER — Other Ambulatory Visit: Payer: Self-pay

## 2021-10-18 ENCOUNTER — Ambulatory Visit: Payer: Managed Care, Other (non HMO) | Admitting: Family Medicine

## 2021-10-18 ENCOUNTER — Encounter: Payer: Self-pay | Admitting: Family Medicine

## 2021-10-18 VITALS — BP 112/72 | HR 90 | Temp 97.9°F | Ht 64.0 in | Wt 160.0 lb

## 2021-10-18 DIAGNOSIS — R002 Palpitations: Secondary | ICD-10-CM | POA: Diagnosis not present

## 2021-10-18 DIAGNOSIS — R000 Tachycardia, unspecified: Secondary | ICD-10-CM

## 2021-10-18 DIAGNOSIS — Z8249 Family history of ischemic heart disease and other diseases of the circulatory system: Secondary | ICD-10-CM | POA: Diagnosis not present

## 2021-10-18 MED ORDER — PROPRANOLOL HCL 20 MG PO TABS: 20.0000 mg | ORAL_TABLET | Freq: Every day | ORAL | 1 refills | Status: DC | PRN

## 2021-10-18 NOTE — Patient Instructions (Signed)
Palpitations Palpitations are feelings that your heartbeat is not normal. Your heartbeat may feel like it is: Uneven (irregular). Faster than normal. Fluttering. Skipping a beat. This is usually not a serious problem. However, a doctor will do tests and check your medical history to make sure that you do not have a serious heart problem. Follow these instructions at home: Watch for any changes in your condition. Tell your doctor about any changes. Take these actions to help manage your symptoms: Eating and drinking Follow instructions from your doctor about things to eat and drink. You may be told to avoid these things: Drinks that have caffeine in them, such as coffee, tea, soft drinks, and energy drinks. Chocolate. Alcohol. Diet pills. Lifestyle   Try to lower your stress. These things can help you relax: Yoga. Deep breathing and meditation. Guided imagery. This is using words and images to create positive thoughts. Exercise, including swimming, jogging, and walking. Tell your doctor if you have more abnormal heartbeats when you are active. If you have chest pain or feel short of breath with exercise, do not keep doing the exercise until you are seen by your doctor. Biofeedback. This is using your mind to control things in your body, such as your heartbeat. Get plenty of rest and sleep. Keep a regular bed time. Do not use drugs, such as cocaine or ecstasy. Do not use marijuana. Do not smoke or use any products that contain nicotine or tobacco. If you need help quitting, ask your doctor. General instructions Take over-the-counter and prescription medicines only as told by your doctor. Keep all follow-up visits. You may need more tests if palpitations do not go away or get worse. Contact a doctor if: You keep having fast or uneven heartbeats for a long time. Your symptoms happen more often. Get help right away if: You have chest pain. You feel short of breath. You have a very bad  headache. You feel dizzy. You faint. These symptoms may be an emergency. Get help right away. Call your local emergency services (911 in the [place] Do not wait to see if the symptoms will go away. Do not drive yourself to the hospital. Summary Palpitations are feelings that your heartbeat is uneven or faster than normal. It may feel like your heart is fluttering or skipping a beat. Avoid food and drinks that may cause this condition. These include caffeine, chocolate, and alcohol. Try to lower your stress. Do not smoke or use drugs. Get help right away if you faint, feel dizzy, feel short of breath, have chest pain, or have a very bad headache. This information is not intended to replace advice given to you by your health care provider. Make sure you discuss any questions you have with your health care provider. Document Revised: 02/02/2021 Document Reviewed: 02/02/2021 Elsevier Patient Education  2022 Elsevier Inc.  

## 2021-10-18 NOTE — Progress Notes (Signed)
This visit occurred during the SARS-CoV-2 public health emergency.  Safety protocols were in place, including screening questions prior to the visit, additional usage of staff PPE, and extensive cleaning of exam room while observing appropriate contact time as indicated for disinfecting solutions.    Anna Mccullough , 1966-05-02, 56 y.o., female MRN: [id number] Patient Care Team    Relationship Specialty Notifications Start End  Anna Leatherwood, DO PCP - General Family Medicine  10/20/19   Anna Gosselin, MD Consulting Physician Orthopedic Surgery  10/23/19   Anna Benne, MD Consulting Physician Ophthalmology  10/23/19   Anna Majors, MD (Inactive) Consulting Physician Obstetrics and Gynecology  11/18/20   Anna Metro, NP Nurse Practitioner Orthopedic Surgery  11/18/20     Chief Complaint  Patient presents with   Hypertension    Pt reports elevated 230-132/140-89 for the last 2 weeks; pt c/o chest tightness, palpitations x 2 weeks worsen in last 5 days; reports mild chest tightness now      Subjective: Pt presents for an OV with complaints of palpitations and elevated heart rate.  She states that the time of this event she was extremely stressed.  She reports work is kind of a toxic environment currently.  She went straight from her work to friends of hers that her EMS to be checked and she had a blood pressure reported of 230/140.  They performed an EKG at that time which did not show signs of STEMI.  Patient reports she will have palpitations, increased heart rate on occasions when she is stressed at work and there is more stress at work most recently.  She would rather not take a medication daily for her stress.  She does have a family history of heart disease.  Depression screen [facility] 2/9 11/18/2020 10/20/2019  Decreased Interest 0 0  Down, Depressed, Hopeless 0 0  PHQ - 2 Score 0 0    No Known Allergies Social History   Social History Narrative   Marital  status/children/pets: Married.  2 children.   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   Safety:      -smoke alarm in the home:Yes     - wears seatbelt: Yes     - Feels safe in their relationships: Yes   Past Medical History:  Diagnosis Date   Acute pain of right knee 10/23/2019   ASCUS on Pap smear 2009   C&B 2009-BENIGN   Cervical radiculopathy 10/23/2019   c6-7 mild degenerative changes.    Migraine    Nephrolithiasis    Rabies exposure 02/02/2019   Past Surgical History:  Procedure Laterality Date   BASAL CELL CARCINOMA EXCISION     CESAREAN SECTION     CESAREAN SECTION  1999   CESAREAN SECTION  2004   COLPOSCOPY  2009   B9   KIDNEY SURGERY     REMOVAL OF KIDNEY STONES X 2   KNEE SURGERY  1988   RIGHT KNEE SURGERY   Family History  Problem Relation Age of Onset   Hypertension Mother    Cancer Father        prostate   Hypertension Father    Heart attack Father    Hyperlipidemia Father    Kidney Stones Father    Leukemia Paternal Grandfather    Stroke Maternal Grandmother    Heart attack Maternal Grandfather    Allergies as of 10/18/2021   No Known Allergies  Medication List        Accurate as of October 18, 2021 11:59 PM. If you have any questions, ask your nurse or doctor.          atorvastatin 10 MG tablet Commonly known as: LIPITOR Take 1 tablet (10 mg total) by mouth daily.   b complex vitamins capsule Take 1 capsule by mouth daily.   cetirizine 10 MG tablet Commonly known as: ZYRTEC Take by mouth.   norethindrone 0.35 MG tablet Commonly known as: Ortho Micronor Take 1 tablet (0.35 mg total) by mouth daily.   propranolol 20 MG tablet Commonly known as: INDERAL Take 1 tablet (20 mg total) by mouth daily as needed. Started by: Anna Pacinienee Maleiah Dula, DO        All past medical history, surgical history, allergies, family history, immunizations andmedications were updated in the EMR today and reviewed  under the history and medication portions of their EMR.     ROS Negative, with the exception of above mentioned in HPI   Objective:  BP 112/72    Pulse 90    Temp 97.9 F (36.6 C) (Oral)    Ht 5\' 4"  (1.626 m)    Wt 160 lb (72.6 kg)    SpO2 99%    BMI 27.46 kg/m  Body mass index is 27.46 kg/m. Physical Exam Gen: Afebrile. No acute distress. Nontoxic in appearance, well developed, well nourished.  HENT: AT. [place].  Eyes:Pupils Equal Round Reactive to light, Extraocular movements intact,  Conjunctiva without redness, discharge or icterus. CV: RRR no murmur, no edema Chest: CTAB, no wheeze or crackles. Good air movement, normal resp effort.  Neuro: Normal gait. PERLA. EOMi. Alert. Oriented x3  Psych: Normal affect, dress and demeanor. Normal speech. Normal thought content and judgment.  No results found. No results found. No results found for this or any previous visit (from the past 24 hour(s)).  Assessment/Plan: Anna Mccullough is a 56 y.o. female present for OV for  Palpitations/Tachycardia/FH: heart disease - event seems situational surrounding work stress, which is rather severe currently We discussed many options today to address the issue. Fortunately her BP looks great here.  She elected to have propranolol 20 mg on hand, in the event she feels panic occur again.   - pt would rather not start a  daily medication for anxiety - pt declines referral for therapist.  - Ambulatory referral to Cardiology placed.  She has a family history of heart disease -most recent event had some chest discomfort with rather significantly elevated blood pressures.  She is agreeable to be evaluated by cardiology.  She request Anna Mccullough, her father sees this particular cardiologist.    Reviewed expectations re: course of current medical issues. Discussed self-management of symptoms. Outlined signs and symptoms indicating need for more acute intervention. Patient verbalized understanding and all  questions were answered. Patient received an After-Visit Summary.    Orders Placed This Encounter  Procedures   Ambulatory referral to Cardiology   Meds ordered this encounter  Medications   propranolol (INDERAL) 20 MG tablet    Sig: Take 1 tablet (20 mg total) by mouth daily as needed.    Dispense:  30 tablet    Refill:  1   Referral Orders         Ambulatory referral to Cardiology       Note is dictated utilizing voice recognition software. Although note has been proof read prior to signing, occasional typographical errors still can be missed. If  any questions arise, please do not hesitate to call for verification.   electronically signed by:  Anna Pacini, DO  [facility] [place]

## 2021-10-25 ENCOUNTER — Ambulatory Visit: Payer: Managed Care, Other (non HMO) | Admitting: Obstetrics and Gynecology

## 2021-11-09 ENCOUNTER — Other Ambulatory Visit: Payer: Self-pay | Admitting: Family Medicine

## 2021-11-16 ENCOUNTER — Encounter: Payer: Self-pay | Admitting: Obstetrics and Gynecology

## 2021-11-18 ENCOUNTER — Other Ambulatory Visit: Payer: Self-pay

## 2021-11-21 ENCOUNTER — Encounter: Payer: Self-pay | Admitting: Family Medicine

## 2021-11-21 ENCOUNTER — Other Ambulatory Visit: Payer: Self-pay

## 2021-11-21 ENCOUNTER — Ambulatory Visit (INDEPENDENT_AMBULATORY_CARE_PROVIDER_SITE_OTHER): Payer: Managed Care, Other (non HMO) | Admitting: Family Medicine

## 2021-11-21 VITALS — BP 132/84 | HR 82 | Temp 97.9°F | Ht 64.0 in | Wt 160.0 lb

## 2021-11-21 DIAGNOSIS — Z0000 Encounter for general adult medical examination without abnormal findings: Secondary | ICD-10-CM | POA: Diagnosis not present

## 2021-11-21 DIAGNOSIS — E782 Mixed hyperlipidemia: Secondary | ICD-10-CM | POA: Diagnosis not present

## 2021-11-21 LAB — COMPREHENSIVE METABOLIC PANEL
ALT: 14 U/L (ref 0–35)
AST: 19 U/L (ref 0–37)
Albumin: 4.4 g/dL (ref 3.5–5.2)
Alkaline Phosphatase: 66 U/L (ref 39–117)
BUN: 17 mg/dL (ref 6–23)
CO2: 30 mEq/L (ref 19–32)
Calcium: 9.3 mg/dL (ref 8.4–10.5)
Chloride: 102 mEq/L (ref 96–112)
Creatinine, Ser: 0.93 mg/dL (ref 0.40–1.20)
GFR: 70.18 mL/min (ref >60.00)
Glucose, Bld: 84 mg/dL (ref 70–99)
Potassium: 4 mEq/L (ref 3.5–5.1)
Sodium: 138 mEq/L (ref 135–145)
Total Bilirubin: 0.6 mg/dL (ref 0.2–1.2)
Total Protein: 6.7 g/dL (ref 6.0–8.3)

## 2021-11-21 LAB — CBC
HCT: 41.1 % (ref 36.0–46.0)
Hemoglobin: 13.7 g/dL (ref 12.0–15.0)
MCHC: 33.2 g/dL (ref 30.0–36.0)
MCV: 90.5 fl (ref 78.0–100.0)
Platelets: 249 10*3/uL (ref 150.0–400.0)
RBC: 4.55 Mil/uL (ref 3.87–5.11)
RDW: 13 % (ref 11.5–15.5)
WBC: 7.2 10*3/uL (ref 4.0–10.5)

## 2021-11-21 LAB — LIPID PANEL
Cholesterol: 154 mg/dL (ref 0–200)
HDL: 52.8 mg/dL (ref >39.00)
LDL Cholesterol: 83 mg/dL (ref 0–99)
NonHDL: 101.42
Total CHOL/HDL Ratio: 3
Triglycerides: 90 mg/dL (ref 0.0–149.0)
VLDL: 18 mg/dL (ref 0.0–40.0)

## 2021-11-21 LAB — HEMOGLOBIN A1C: Hgb A1c MFr Bld: 6 % (ref 4.6–6.5)

## 2021-11-21 MED ORDER — ATORVASTATIN CALCIUM 10 MG PO TABS: 10.0000 mg | ORAL_TABLET | Freq: Every day | ORAL | 4 refills | Status: DC

## 2021-11-21 MED ORDER — PROPRANOLOL HCL 20 MG PO TABS
20.0000 mg | ORAL_TABLET | Freq: Every day | ORAL | 11 refills | Status: DC | PRN
Start: 2021-11-21 — End: 2022-06-12

## 2021-11-21 NOTE — Patient Instructions (Signed)
°Great to see you today.  °I have refilled the medication(s) we provide.  ° °If labs were collected, we will inform you of lab results once received either by echart message or telephone call.  ° - echart message- for normal results that have been seen by the patient already.  ° - telephone call: abnormal results or if patient has not viewed results in their echart. ° °Health Maintenance, Female °Adopting a healthy lifestyle and getting preventive care are important in promoting health and wellness. Ask your health care provider about: °The right schedule for you to have regular tests and exams. °Things you can do on your own to prevent diseases and keep yourself healthy. °What should I know about diet, weight, and exercise? °Eat a healthy diet ° °Eat a diet that includes plenty of vegetables, fruits, low-fat dairy products, and lean protein. °Do not eat a lot of foods that are high in solid fats, added sugars, or sodium. °Maintain a healthy weight °Body mass index (BMI) is used to identify weight problems. It estimates body fat based on height and weight. Your health care provider can help determine your BMI and help you achieve or maintain a healthy weight. °Get regular exercise °Get regular exercise. This is one of the most important things you can do for your health. Most adults should: °Exercise for at least 150 minutes each week. The exercise should increase your heart rate and make you sweat (moderate-intensity exercise). °Do strengthening exercises at least twice a week. This is in addition to the moderate-intensity exercise. °Spend less time sitting. Even light physical activity can be beneficial. °Watch cholesterol and blood lipids °Have your blood tested for lipids and cholesterol at 54 years of age, then have this test every 5 years. °Have your cholesterol levels checked more often if: °Your lipid or cholesterol levels are high. °You are older than 54 years of age. °You are at high risk for heart  disease. °What should I know about cancer screening? °Depending on your health history and family history, you may need to have cancer screening at various ages. This may include screening for: °Breast cancer. °Cervical cancer. °Colorectal cancer. °Skin cancer. °Lung cancer. °What should I know about heart disease, diabetes, and high blood pressure? °Blood pressure and heart disease °High blood pressure causes heart disease and increases the risk of stroke. This is more likely to develop in people who have high blood pressure readings or are overweight. °Have your blood pressure checked: °Every 3-5 years if you are 18-39 years of age. °Every year if you are 40 years old or older. °Diabetes °Have regular diabetes screenings. This checks your fasting blood sugar level. Have the screening done: °Once every three years after age 40 if you are at a normal weight and have a low risk for diabetes. °More often and at a younger age if you are overweight or have a high risk for diabetes. °What should I know about preventing infection? °Hepatitis B °If you have a higher risk for hepatitis B, you should be screened for this virus. Talk with your health care provider to find out if you are at risk for hepatitis B infection. °Hepatitis C °Testing is recommended for: °Everyone born from 1945 through 1965. °Anyone with known risk factors for hepatitis C. °Sexually transmitted infections (STIs) °Get screened for STIs, including gonorrhea and chlamydia, if: °You are sexually active and are younger than 54 years of age. °You are older than 54 years of age and your health care provider   tells you that you are at risk for this type of infection. °Your sexual activity has changed since you were last screened, and you are at increased risk for chlamydia or gonorrhea. Ask your health care provider if you are at risk. °Ask your health care provider about whether you are at high risk for HIV. Your health care provider may recommend a  prescription medicine to help prevent HIV infection. If you choose to take medicine to prevent HIV, you should first get tested for HIV. You should then be tested every 3 months for as long as you are taking the medicine. °Pregnancy °If you are about to stop having your period (premenopausal) and you may become pregnant, seek counseling before you get pregnant. °Take 400 to 800 micrograms (mcg) of folic acid every day if you become pregnant. °Ask for birth control (contraception) if you want to prevent pregnancy. °Osteoporosis and menopause °Osteoporosis is a disease in which the bones lose minerals and strength with aging. This can result in bone fractures. If you are 65 years old or older, or if you are at risk for osteoporosis and fractures, ask your health care provider if you should: °Be screened for bone loss. °Take a calcium or vitamin D supplement to lower your risk of fractures. °Be given hormone replacement therapy (HRT) to treat symptoms of menopause. °Follow these instructions at home: °Alcohol use °Do not drink alcohol if: °Your health care provider tells you not to drink. °You are pregnant, may be pregnant, or are planning to become pregnant. °If you drink alcohol: °Limit how much you have to: °0-1 drink a day. °Know how much alcohol is in your drink. In the [place] one drink equals one 12 oz bottle of beer (355 mL), one 5 oz glass of wine (148 mL), or one 1½ oz glass of hard liquor (44 mL). °Lifestyle °Do not use any products that contain nicotine or tobacco. These products include cigarettes, chewing tobacco, and vaping devices, such as e-cigarettes. If you need help quitting, ask your health care provider. °Do not use street drugs. °Do not share needles. °Ask your health care provider for help if you need support or information about quitting drugs. °General instructions °Schedule regular health, dental, and eye exams. °Stay current with your vaccines. °Tell your health care provider if: °You often  feel depressed. °You have ever been abused or do not feel safe at home. °Summary °Adopting a healthy lifestyle and getting preventive care are important in promoting health and wellness. °Follow your health care provider's instructions about healthy diet, exercising, and getting tested or screened for diseases. °Follow your health care provider's instructions on monitoring your cholesterol and blood pressure. °This information is not intended to replace advice given to you by your health care provider. Make sure you discuss any questions you have with your health care provider. °Document Revised: 01/31/2021 Document Reviewed: 01/31/2021 °Elsevier Patient Education © 2022 [employer] Inc. ° °

## 2021-11-21 NOTE — Progress Notes (Signed)
This visit occurred during the SARS-CoV-2 public health emergency.  Safety protocols were in place, including screening questions prior to the visit, additional usage of staff PPE, and extensive cleaning of exam room while observing appropriate contact time as indicated for disinfecting solutions.    Patient ID: Anna Mccullough, female  DOB: 12/08/1965, 56 y.o.   MRN: [id number] Patient Care Team    Relationship Specialty Notifications Start End  Ma Hillock, DO PCP - General Family Medicine  10/20/19   Latanya Maudlin, MD Consulting Physician Orthopedic Surgery  10/23/19   Sharyne Peach, MD Consulting Physician Ophthalmology  10/23/19   Joseph Pierini, MD (Inactive) Consulting Physician Obstetrics and Gynecology  11/18/20   Vinnie Level, NP Nurse Practitioner Orthopedic Surgery  11/18/20     Chief Complaint  Patient presents with   Annual Exam    Pt is not fasting    Subjective: Anna Mccullough is a 56 y.o.  Female  present for CPE. All past medical history, surgical history, allergies, family history, immunizations, medications and social history were updated in the electronic medical record today. All recent labs, ED visits and hospitalizations within the last year were reviewed.  Health maintenance:  Colonoscopy: declined again today- offered cologuard and declined Mammogram: completed 10/2021 [facility]. > ordered by Dr. Quincy Simmonds at [facility] Cervical cancer screening: last pap: 2022, Dr. Quincy Simmonds Immunizations: tdap UTD 2021, Influenza UTD 2022(encouraged yearly), zostavax - declined ,  covid x3 Infectious disease screening: HIV and  Hep C  completed DeXA: routine screen Assistive device: none Oxygen [id number] Patient has a Dental home. Hospitalizations/ED visits: reviewed  Depression screen [facility] 2/9 11/21/2021 11/18/2020 10/20/2019  Decreased Interest 0 0 0  Down, Depressed, Hopeless 0 0 0  PHQ - 2 Score 0 0 0   No flowsheet data  found.   Immunization History  Administered Date(s) Administered   Influenza Inj Mdck Quad Pf 06/24/2019   Influenza,inj,Quad PF,6+ Mos 06/15/2017, 07/11/2021   Influenza-Unspecified 07/24/2020   PFIZER(Purple Top)SARS-COV-2 Vaccination 10/23/2019, 11/14/2019, 06/22/2020   Rabies, IM 01/30/2019, 01/30/2019, 02/02/2019, 02/06/2019, 02/13/2019   Tdap 01/19/2020    Past Medical History:  Diagnosis Date   Acute pain of right knee 10/23/2019   ASCUS on Pap smear 2009   C&B 2009-BENIGN   Cervical radiculopathy 10/23/2019   c6-7 mild degenerative changes.    Migraine    Nephrolithiasis    Rabies exposure 02/02/2019   No Known Allergies Past Surgical History:  Procedure Laterality Date   BASAL CELL CARCINOMA EXCISION     CESAREAN SECTION     CESAREAN SECTION  1999   CESAREAN SECTION  2004   COLPOSCOPY  2009   B9   KIDNEY SURGERY     REMOVAL OF KIDNEY STONES X 2   KNEE SURGERY  1988   RIGHT KNEE SURGERY   Family History  Problem Relation Age of Onset   Hypertension Mother    Cancer Father        prostate   Hypertension Father    Heart attack Father    Hyperlipidemia Father    Kidney Stones Father    Leukemia Paternal Grandfather    Stroke Maternal Grandmother    Heart attack Maternal Grandfather    Social History   Social History Narrative   Marital status/children/pets: Married.  2 children.   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   Safety:      -smoke alarm in the home:Yes     -  wears seatbelt: Yes     - Feels safe in their relationships: Yes    Allergies as of 11/21/2021   No Known Allergies      Medication List        Accurate as of November 21, 2021 11:20 AM. If you have any questions, ask your nurse or doctor.          STOP taking these medications    cetirizine 10 MG tablet Commonly known as: ZYRTEC Stopped by: Felix Pacini, DO       TAKE these medications    atorvastatin 10 MG tablet Commonly  known as: LIPITOR Take 1 tablet (10 mg total) by mouth daily.   b complex vitamins capsule Take 1 capsule by mouth daily.   loratadine 10 MG tablet Commonly known as: CLARITIN Take 10 mg by mouth daily.   norethindrone 0.35 MG tablet Commonly known as: Ortho Micronor Take 1 tablet (0.35 mg total) by mouth daily.   propranolol 20 MG tablet Commonly known as: INDERAL Take 1 tablet (20 mg total) by mouth daily as needed.        All past medical history, surgical history, allergies, family history, immunizations andmedications were updated in the EMR today and reviewed under the history and medication portions of their EMR.     Recent Results (from the past 2160 hour(s))  Cytology - PAP( [place])     Status: None   Collection Time: 10/07/21  3:37 PM  Result Value Ref Range   High risk HPV Negative    Adequacy      Satisfactory for evaluation; transformation zone component PRESENT.   Diagnosis      - Negative for intraepithelial lesion or malignancy (NILM)   Comment Normal Reference Range HPV - Negative     No results found.   ROS 14 pt review of systems performed and negative (unless mentioned in an HPI)  Objective: BP 132/84    Pulse 82    Temp 97.9 F (36.6 C) (Oral)    Ht 5\' 4"  (1.626 m)    Wt 160 lb (72.6 kg)    SpO2 100%    BMI 27.46 kg/m  Physical Exam Vitals and nursing note reviewed.  Constitutional:      General: She is not in acute distress.    Appearance: Normal appearance. She is not ill-appearing or toxic-appearing.  HENT:     Head: Normocephalic and atraumatic.     Right Ear: Tympanic membrane, ear canal and external ear normal. There is no impacted cerumen.     Left Ear: Tympanic membrane, ear canal and external ear normal. There is no impacted cerumen.     Nose: No congestion or rhinorrhea.     Mouth/Throat:     Mouth: Mucous membranes are moist.     Pharynx: Oropharynx is clear. No oropharyngeal exudate or posterior oropharyngeal erythema.   Eyes:     General: No scleral icterus.       Right eye: No discharge.        Left eye: No discharge.     Extraocular Movements: Extraocular movements intact.     Conjunctiva/sclera: Conjunctivae normal.     Pupils: Pupils are equal, round, and reactive to light.  Cardiovascular:     Rate and Rhythm: Normal rate and regular rhythm.     Pulses: Normal pulses.     Heart sounds: Normal heart sounds. No murmur heard.   No friction rub. No gallop.  Pulmonary:     Effort:  Pulmonary effort is normal. No respiratory distress.     Breath sounds: Normal breath sounds. No stridor. No wheezing, rhonchi or rales.  Chest:     Chest wall: No tenderness.  Abdominal:     General: Abdomen is flat. Bowel sounds are normal. There is no distension.     Palpations: Abdomen is soft. There is no mass.     Tenderness: There is no abdominal tenderness. There is no right CVA tenderness, left CVA tenderness, guarding or rebound.     Hernia: No hernia is present.  Musculoskeletal:        General: No swelling, tenderness or deformity. Normal range of motion.     Cervical back: Normal range of motion and neck supple. No rigidity or tenderness.     Right lower leg: No edema.     Left lower leg: No edema.  Lymphadenopathy:     Cervical: No cervical adenopathy.  Skin:    General: Skin is warm and dry.     Coloration: Skin is not jaundiced or pale.     Findings: No bruising, erythema, lesion or rash.  Neurological:     General: No focal deficit present.     Mental Status: She is alert and oriented to person, place, and time. Mental status is at baseline.     Cranial Nerves: No cranial nerve deficit.     Sensory: No sensory deficit.     Motor: No weakness.     Coordination: Coordination normal.     Gait: Gait normal.     Deep Tendon Reflexes: Reflexes normal.  Psychiatric:        Mood and Affect: Mood normal.        Behavior: Behavior normal.        Thought Content: Thought content normal.         Judgment: Judgment normal.    No results found.  Assessment/plan: Anna Mccullough is a 56 y.o. female present for CPE  Mixed hyperlipidemia Continue lipitor 10 mg qd Propanolol 20 mg PRN (anxiety) tachycardia - CBC - Comprehensive metabolic panel - Hemoglobin A1c - Lipid panel - TSH  Routine general medical examination at a health care facility Colonoscopy: declined again today- offered cologuard and declined Mammogram: completed 10/2021 [facility]. > ordered by Dr. Quincy Simmonds at [facility] Cervical cancer screening: last pap: 2022, Dr. Quincy Simmonds Immunizations: tdap UTD 2021, Influenza UTD 2022(encouraged yearly), zostavax - declined ,  covid x3 Infectious disease screening: HIV and  Hep C  completed DeXA: routine screen Patient was encouraged to exercise greater than 150 minutes a week. Patient was encouraged to choose a diet filled with fresh fruits and vegetables, and lean meats. AVS provided to patient today for education/recommendation on gender specific health and safety maintenance.  Return in about 1 year (around 11/22/2022) for CPE (30 min).  Orders Placed This Encounter  Procedures   CBC   Comprehensive metabolic panel   Hemoglobin A1c   Lipid panel   TSH   Meds ordered this encounter  Medications   atorvastatin (LIPITOR) 10 MG tablet    Sig: Take 1 tablet (10 mg total) by mouth daily.    Dispense:  90 tablet    Refill:  4   propranolol (INDERAL) 20 MG tablet    Sig: Take 1 tablet (20 mg total) by mouth daily as needed.    Dispense:  30 tablet    Refill:  11    Hold until pt request only  Referral Orders  No referral(s) requested today   Electronically signed by: Howard Pouch, [place]

## 2021-11-22 LAB — TSH: TSH: 1.74 u[IU]/mL (ref 0.35–5.50)

## 2021-12-08 ENCOUNTER — Other Ambulatory Visit: Payer: Self-pay

## 2021-12-08 ENCOUNTER — Encounter: Payer: Self-pay | Admitting: Cardiology

## 2021-12-08 ENCOUNTER — Ambulatory Visit: Payer: Managed Care, Other (non HMO) | Admitting: Cardiology

## 2021-12-08 VITALS — BP 127/85 | HR 52 | Temp 98.5°F | Resp 16 | Ht 64.0 in | Wt 163.8 lb

## 2021-12-08 DIAGNOSIS — R002 Palpitations: Secondary | ICD-10-CM

## 2021-12-08 MED ORDER — PROPRANOLOL HCL ER 60 MG PO CP24: 60.0000 mg | ORAL_CAPSULE | Freq: Every day | ORAL | 2 refills | Status: DC

## 2021-12-08 NOTE — Progress Notes (Signed)
? ?Primary Physician/Referring:  Felix PaciniKuneff, Renee A, DO ? ?Patient ID: Anna Mccullough, female    DOB: May 17, 1966, 56 y.o.   MRN: [id number] ? ?Chief Complaint  ?Patient presents with  ? Palpitations  ? Tachycardia  ? Family Hx of Heart Disease  ? New Patient (Initial Visit)  ?  Referred by Dr. Felix Pacinienee Kuneff  ? ?HPI:   ? ?Anna Mccullough  is a 56 y.o. 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  She has hyperlipidemia, recently started on propranolol for both palpitations and elevated blood pressure.  She presented to close by EMS center where EKG revealed normal sinus rhythm, blood pressure was markedly elevated at 230/140 mmHg.  She was recommended to go to the emergency room, however she calmed down, blood pressure improved and was seen by her PCP where she was started on propranolol low-dose and referred to me for further evaluation.  Past medical history is also significant for hyperglycemia and urolithiasis. ? ?I do take care of her father who has coronary artery disease, hypertension and prediabetes mellitus.  Patient is very concerned about her cardiac status.  Denies chest pain or shortness of breath. ? ?Past Medical History:  ?Diagnosis Date  ? Acute pain of right knee 10/23/2019  ? ASCUS on Pap smear 2009  ? C&B 2009-BENIGN  ? Cervical radiculopathy 10/23/2019  ? c6-7 mild degenerative changes.   ? Hyperlipidemia   ? Migraine   ? Nephrolithiasis   ? Rabies exposure 02/02/2019  ? ?Past Surgical History:  ?Procedure Laterality Date  ? BASAL CELL CARCINOMA EXCISION    ? CESAREAN SECTION    ? CESAREAN SECTION  1999  ? CESAREAN SECTION  2004  ? COLPOSCOPY  2009  ? B9  ? KIDNEY SURGERY    ? REMOVAL OF KIDNEY STONES X 2  ? KNEE SURGERY  1988  ? RIGHT KNEE SURGERY  ? ?Family History  ?Problem Relation Age of Onset  ? Hypertension Mother   ? Cancer Father   ?      prostate  ? Hypertension Father   ? Heart attack Father   ? Hyperlipidemia Father   ? Kidney Stones Father   ? Leukemia Paternal Grandfather   ? Stroke Maternal Grandmother   ? Heart attack Maternal Grandfather   ?  ?Social History  ? ?Tobacco Use  ? Smoking status: Never  ? Smokeless tobacco: Never  ?Substance Use Topics  ? Alcohol use: Yes  ?  Alcohol/week: 1.0 standard drink  ?  Types: 1 Standard drinks or equivalent per week  ?  Comment: Rare  ? ?Marital Status: Married  ?ROS  ?ROS ?Objective  ?Blood pressure 127/85, pulse (!) 52, temperature 98.5 ?F (36.9 ?C), temperature source Temporal, resp. rate 16, height 5\' 4"  (1.626 m), weight 163 lb 12.8 oz (74.3 kg), SpO2 97 %. Body mass index is 28.12 kg/m?.  ?Vitals with BMI 12/08/2021 11/21/2021 10/18/2021  ?Height 5\' 4"  5\' 4"  5\' 4"   ?Weight 163 lbs 13 oz 160 lbs 160 lbs  ?BMI 28.1 27.45 27.45  ?Systolic 127 132 119112  ?Diastolic 85 84 72  ?Pulse 52 82 90  ?  ?Physical Exam  ? ?Medications and allergies  ?No Known Allergies  ? ?Medication list after today's encounter  ? ?Current Outpatient Medications:  ?  atorvastatin (LIPITOR) 10 MG tablet, Take 1 tablet (10 mg total) by mouth daily.,  Disp: 90 tablet, Rfl: 4 ?  b complex vitamins capsule, Take 1 capsule by mouth daily., Disp: , Rfl:  ?  loratadine (CLARITIN) 10 MG tablet, Take 10 mg by mouth daily., Disp: , Rfl:  ?  norethindrone (ORTHO MICRONOR) 0.35 MG tablet, Take 1 tablet (0.35 mg total) by mouth daily., Disp: 84 tablet, Rfl: 3 ?  propranolol (INDERAL) 20 MG tablet, Take 1 tablet (20 mg total) by mouth daily as needed., Disp: 30 tablet, Rfl: 11 ?  propranolol ER (INDERAL LA) 60 MG 24 hr capsule, Take 1 capsule (60 mg total) by mouth daily after supper., Disp: 30 capsule, Rfl: 2 ? ?Laboratory examination:  ? ?Recent Labs  ?  11/21/21 ?1120  ?NA 138  ?K 4.0  ?CL 102  ?CO2 30  ?GLUCOSE 84  ?BUN 17  ?CREATININE 0.93  ?CALCIUM 9.3  ? ?estimated creatinine clearance is 69 mL/min (by C-G formula based on SCr of 0.93  mg/dL).  ?CMP Latest Ref Rng & Units 11/21/2021 11/18/2020 01/19/2020  ?Glucose 70 - 99 mg/dL 84 76 87  ?BUN 6 - 23 mg/dL 17 17 16   ?Creatinine 0.40 - 1.20 mg/dL 6.44 0.34  ?Sodium 135 - 145 mEq/L 138 140 139  ?Potassium 3.5 - 5.1 mEq/L 4.0 4.4 4.6  ?Chloride 96 - 112 mEq/L 102 101 100  ?CO2 19 - 32 mEq/L 30 31 32  ?Calcium 8.4 - 10.5 mg/dL 9.3 9.5 9.6  ?Total Protein 6.0 - 8.3 g/dL 6.7 6.7 6.7  ?Total Bilirubin 0.2 - 1.2 mg/dL 0.6 0.5 0.3  ?Alkaline Phos 39 - 117 U/L 66 62 82  ?AST 0 - 37 U/L 19 16 15   ?ALT 0 - 35 U/L 14 10 13   ? ?CBC Latest Ref Rng & Units 11/21/2021 11/18/2020 08/29/2019  ?WBC 4.0 - 10.5 K/uL 7.2 6.3 8.0  ?Hemoglobin 12.0 - 15.0 g/dL 11/23/2021 11/20/2020 14/12/2018  ?Hematocrit 36.0 - 46.0 % 41.1 43.1 41.5  ?Platelets 150.0 - 400.0 K/uL 249.0 273.0 313  ? ? ?Lipid Panel ?Recent Labs  ?  11/21/21 ?1120  ?CHOL 154  ?TRIG 90.0  ?LDLCALC 83  ?VLDL 18.0  ?HDL 52.80  ?CHOLHDL 3  ? ?HEMOGLOBIN A1C ?Lab Results  ?Component Value Date  ? HGBA1C 6.0 11/21/2021  ? ?TSH ?Recent Labs  ?  11/21/21 ?1120  ?TSH 1.74  ? ?Radiology:  ?NA ?Cardiac Studies:  ?NA ?EKG:  ? ?EKG 12/08/2021: Normal sinus rhythm at rate of 82 bpm, normal axis, incomplete right bundle branch block.  Otherwise normal EKG. compared to the EKG done on 10/13/2021, no significant change.  Blood pressure noted to be 230/140, improved to 180/90 mmHg. ? ?Assessment  ? ?  ICD-10-CM   ?1. Palpitations  R00.2 EKG 12-Lead  ?  propranolol ER (INDERAL LA) 60 MG 24 hr capsule  ?  [facility]V CARDIAC STRESS TEST  ?  ?2. Primary hypertension  I10 propranolol ER (INDERAL LA) 60 MG 24 hr capsule  ?  PCV ECHOCARDIOGRAM COMPLETE  ?  PCV CARDIAC STRESS TEST  ?  ?3. Hypercholesteremia  E78.00   ?  ?  ?There are no discontinued medications.  ?Meds ordered this encounter  ?Medications  ? propranolol ER (INDERAL LA) 60 MG 24 hr capsule  ?  Sig: Take 1 capsule (60 mg total) by mouth daily after supper.  ?  Dispense:  30 capsule  ?  Refill:  2  ? ?Orders Placed This Encounter  ?Procedures  ?  PCV CARDIAC STRESS TEST  ?  Standing  Status:   Future  ?  Standing Expiration Date:   02/07/2022  ? EKG 12-Lead  ? PCV ECHOCARDIOGRAM COMPLETE  ?  Standing Status:   Future  ?  Standing Expiration Date:   12/09/2022  ? ?Recommendations:  ? ?LANDRA HOWZE is a 56 y.o.  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  She has hyperlipidemia, recently started on propranolol for both palpitations and elevated blood pressure.  She presented to close by EMS center where EKG revealed normal sinus rhythm, blood pressure was markedly elevated at 230/140 mmHg.  She was seen by her PCP where she was started on propranolol low-dose and referred to me for further evaluation.  Past medical history is also significant for hyperglycemia and urolithiasis. ? ?Her symptoms of palpitations and extreme anxiety may be related to stress-induced hypertension with underlying primary hypertension as well.  She has responded very well and states that since being on propranolol symptoms of nearly resolved.  Hence I would like to start her on long-acting Inderal which would help both with anxiety, stress and also hypertension and palpitations.  As the symptoms are improved, I do not think there is need for an event monitor.  She can still continue to use plain propranolol on a as needed basis if she has breakthrough symptoms. ? ?I will perform a routine treadmill exercise stress test to evaluate blood pressure response and also to evaluate for any inducible arrhythmias.  An echocardiogram will also be obtained. ? ?With regard to hyperlipidemia, she is on appropriate medical therapy and lipids reviewed.  Lipids are at goal.  As she is already on preventive measures, coronary calcium score may not help at this point as she is willing to follow medical advice.  I will see her back in 6 weeks for  follow-up. ? ? ? ?Yates Decamp, MD, [facility] ?12/08/2021, 10:15 AM ?Office: [phone] ?

## 2022-01-20 ENCOUNTER — Ambulatory Visit: Payer: Managed Care, Other (non HMO)

## 2022-01-20 DIAGNOSIS — I10 Essential (primary) hypertension: Secondary | ICD-10-CM

## 2022-01-20 DIAGNOSIS — R002 Palpitations: Secondary | ICD-10-CM

## 2022-01-31 ENCOUNTER — Encounter: Payer: Self-pay | Admitting: Cardiology

## 2022-01-31 DIAGNOSIS — R002 Palpitations: Secondary | ICD-10-CM

## 2022-01-31 DIAGNOSIS — I10 Essential (primary) hypertension: Secondary | ICD-10-CM

## 2022-01-31 MED ORDER — VERAPAMIL HCL ER 120 MG PO TBCR: 120.0000 mg | EXTENDED_RELEASE_TABLET | ORAL | 2 refills | Status: DC

## 2022-01-31 NOTE — Telephone Encounter (Signed)
ICD-10-CM   ?1. Primary hypertension  I10 verapamil (CALAN-SR) 120 MG CR tablet  ?  ?2. Palpitations  R00.2 verapamil (CALAN-SR) 120 MG CR tablet  ?  ? ?Medications Discontinued During This Encounter  ?Medication Reason  ? propranolol ER (INDERAL LA) 60 MG 24 hr capsule Side effect (s)  ?  ?Meds ordered this encounter  ?Medications  ? verapamil (CALAN-SR) 120 MG CR tablet  ?  Sig: Take 1 tablet (120 mg total) by mouth every morning.  ?  Dispense:  30 tablet  ?  Refill:  2  ?  Discontinue Propranolol LA  ?  ? ?My chart Message time spent 10 min. ? ? ? ?Yates Decamp, MD, [facility] ?01/31/2022, 5:08 PM ?Office: [phone] ?Fax: [phone] ?Pager: [phone]  ?

## 2022-01-31 NOTE — Telephone Encounter (Signed)
From pt

## 2022-02-03 ENCOUNTER — Encounter: Payer: Self-pay | Admitting: Family Medicine

## 2022-02-06 ENCOUNTER — Telehealth: Payer: Self-pay
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
## 2022-02-07 ENCOUNTER — Ambulatory Visit: Payer: Managed Care, Other (non HMO) | Admitting: Family Medicine

## 2022-03-04 ENCOUNTER — Other Ambulatory Visit: Payer: Self-pay | Admitting: Cardiology

## 2022-03-04 DIAGNOSIS — R002 Palpitations: Secondary | ICD-10-CM

## 2022-03-04 DIAGNOSIS — I10 Essential (primary) hypertension: Secondary | ICD-10-CM

## 2022-04-28 ENCOUNTER — Other Ambulatory Visit: Payer: Self-pay | Admitting: Cardiology

## 2022-04-28 DIAGNOSIS — I10 Essential (primary) hypertension: Secondary | ICD-10-CM

## 2022-04-28 DIAGNOSIS — R002 Palpitations: Secondary | ICD-10-CM

## 2022-06-12 ENCOUNTER — Ambulatory Visit: Payer: Managed Care, Other (non HMO) | Admitting: Cardiology

## 2022-06-12 ENCOUNTER — Encounter: Payer: Self-pay | Admitting: Cardiology

## 2022-06-12 VITALS — BP 134/83 | HR 68 | Temp 98.1°F | Resp 16 | Ht 64.0 in | Wt 161.8 lb

## 2022-06-12 DIAGNOSIS — R002 Palpitations: Secondary | ICD-10-CM

## 2022-06-12 DIAGNOSIS — I10 Essential (primary) hypertension: Secondary | ICD-10-CM

## 2022-06-12 MED ORDER — VERAPAMIL HCL ER 180 MG PO TBCR: 180.0000 mg | EXTENDED_RELEASE_TABLET | Freq: Every morning | ORAL | 3 refills | Status: DC

## 2022-06-12 NOTE — Progress Notes (Signed)
Primary Physician/Referring:  Ma Hillock, DO  Patient ID: Anna Mccullough, female    DOB: [birth year], 56 y.o.   MRN: [id number]  Chief Complaint  Patient presents with   Palpitations   Hypertension   Follow-up   HPI:    Anna Mccullough  is a 56 y.o. Caucasian female patient, who is a [employer] in the [employer] has noticed worsening symptoms of  palpitations, markedly elevated blood pressure especially during stress at her workplace.   She also has mild hyperglycemia and hypercholesterolemia.  She was seen by me about 3 months ago, initially tried propanolol which she did not tolerate however was switched over to verapamil.  She is now tolerating this with complete resolution of symptoms of palpitations and blood pressure is also significantly improved. Denies chest pain or shortness of breath.  Past Medical History:  Diagnosis Date   Acute pain of right knee 10/23/2019   ASCUS on Pap smear 2009   C&B 2009-BENIGN   Cervical radiculopathy 10/23/2019   c6-7 mild degenerative changes.    Hyperlipidemia    Migraine    Nephrolithiasis    Rabies exposure 02/02/2019   Past Surgical History:  Procedure Laterality Date   BASAL CELL CARCINOMA EXCISION     CESAREAN SECTION     CESAREAN SECTION  1999   CESAREAN SECTION  2004   COLPOSCOPY  2009   B9   KIDNEY SURGERY     REMOVAL OF KIDNEY STONES X 2   KNEE SURGERY  1988   RIGHT KNEE SURGERY   Family History  Problem Relation Age of Onset   Hypertension Mother    Cancer Father        prostate   Hypertension Father    Heart attack Father    Hyperlipidemia Father    Kidney Stones Father    Leukemia Paternal Grandfather    Stroke Maternal Grandmother    Heart attack Maternal Grandfather     Social History   Tobacco Use   Smoking status: Never   Smokeless tobacco: Never  Substance Use Topics   Alcohol use: Yes    Alcohol/week: 1.0 standard drink of alcohol    Types: 1 Standard drinks or equivalent per week     Comment: Rare   Marital Status: Married  ROS  Review of Systems  Cardiovascular:  Negative for chest pain, dyspnea on exertion and leg swelling.   Objective  Blood pressure 134/83, pulse 68, temperature 98.1 F (36.7 C), temperature source Temporal, resp. rate 16, height 5\' 4"  (1.626 m), weight 161 lb 12.8 oz (73.4 kg), SpO2 100 %. Body mass index is 27.77 kg/m.     06/12/2022   12:48 PM 12/08/2021    9:18 AM 11/21/2021   11:04 AM  Vitals with BMI  Height 5\' 4"  5\' 4"  5\' 4"   Weight 161 lbs 13 oz 163 lbs 13 oz 160 lbs  BMI 27.76 [id number]  Systolic Q000111Q [id number] [id number]  Diastolic 83 85 84  Pulse 68 52 82    Physical Exam Neck:     Vascular: No carotid bruit or JVD.  Cardiovascular:     Rate and Rhythm: Normal rate and regular rhythm.     Pulses: Intact distal pulses.     Heart sounds: Normal heart sounds. No murmur heard.    No gallop.  Pulmonary:     Effort: Pulmonary effort is normal.     Breath sounds: Normal breath sounds.  Abdominal:     General:  Bowel sounds are normal.     Palpations: Abdomen is soft.  Musculoskeletal:     Right lower leg: No edema.     Left lower leg: No edema.      Medications and allergies  No Known Allergies   Medication list after today's encounter   Current Outpatient Medications:    atorvastatin (LIPITOR) 10 MG tablet, Take 1 tablet (10 mg total) by mouth daily., Disp: 90 tablet, Rfl: 4   b complex vitamins capsule, Take 1 capsule by mouth daily., Disp: , Rfl:    meloxicam (MOBIC) 15 MG tablet, Take 15 mg by mouth as needed for pain., Disp: , Rfl:    norethindrone (ORTHO MICRONOR) 0.35 MG tablet, Take 1 tablet (0.35 mg total) by mouth daily., Disp: 84 tablet, Rfl: 3   verapamil (CALAN-SR) 180 MG CR tablet, Take 1 tablet (180 mg total) by mouth every morning., Disp: 90 tablet, Rfl: 3  Laboratory examination:   Recent Labs    11/21/21 1120  NA 138  K 4.0  CL 102  CO2 30  GLUCOSE 84  BUN 17  CREATININE 0.93  CALCIUM 9.3   CrCl  cannot be calculated (Patient's most recent lab result is older than the maximum 21 days allowed.).     Latest Ref Rng & Units 11/21/2021   11:20 AM 11/18/2020    9:54 AM 01/19/2020    8:30 AM  CMP  Glucose 70 - 99 mg/dL 84  76  87   BUN 6 - 23 mg/dL 17  17  16    Creatinine 0.40 - 1.20 mg/dL 0.93  0.95  0.87   Sodium 135 - 145 mEq/L 138  140  139   Potassium 3.5 - 5.1 mEq/L 4.0  4.4  4.6   Chloride 96 - 112 mEq/L 102  101  100   CO2 19 - 32 mEq/L 30  31  32   Calcium 8.4 - 10.5 mg/dL 9.3  9.5  9.6   Total Protein 6.0 - 8.3 g/dL 6.7  6.7  6.7   Total Bilirubin 0.2 - 1.2 mg/dL 0.6  0.5  0.3   Alkaline Phos 39 - 117 U/L 66  62  82   AST 0 - 37 U/L 19  16  15    ALT 0 - 35 U/L 14  10  13        Latest Ref Rng & Units 11/21/2021   11:20 AM 11/18/2020    9:54 AM 08/29/2019    9:26 AM  CBC  WBC 4.0 - 10.5 K/uL 7.2  6.3  8.0   Hemoglobin 12.0 - 15.0 g/dL 13.7  14.4  13.9   Hematocrit 36.0 - 46.0 % 41.1  43.1  41.5   Platelets 150.0 - 400.0 K/uL 249.0  273.0  313     Lipid Panel Recent Labs    11/21/21 1120  CHOL 154  TRIG 90.0  LDLCALC 83  VLDL 18.0  HDL 52.80  CHOLHDL 3   HEMOGLOBIN A1C Lab Results  Component Value Date   HGBA1C 6.0 11/21/2021   TSH Recent Labs    11/21/21 1120  TSH 1.74   Radiology:  NA Cardiac Studies:   PCV CARDIAC STRESS TEST 01/20/2022  Narrative Exercise treadmill stress test 01/20/2022: Exercise treadmill stress test performed using Bruce protocol.  Patient reached 10.1 METS, and 100% of age predicted maximum heart rate.  Exercise capacity was normal.  No chest pain reported.  Normal heart rate and hemodynamic response. Stress EKG revealed no  ischemic changes. Low risk study.   PCV ECHOCARDIOGRAM COMPLETE 01/20/2022  Narrative Echocardiogram 01/20/2022: Left ventricle cavity is normal in size and wall thickness. Normal global wall motion. Normal LV systolic function with EF 59%. Normal diastolic filling pattern. Mild (Grade I) mitral  regurgitation. Normal right atrial pressure.    EKG:   EKG 06/12/2022: Normal sinus rhythm at rate of 73 bpm, normal EKG.  No significant change from 12/08/2021.  Assessment     ICD-10-CM   1. Palpitations  R00.2 EKG 12-Lead    verapamil (CALAN-SR) 180 MG CR tablet    2. Primary hypertension  I10 verapamil (CALAN-SR) 180 MG CR tablet      Medications Discontinued During This Encounter  Medication Reason   loratadine (CLARITIN) 10 MG tablet    propranolol (INDERAL) 20 MG tablet    verapamil (CALAN-SR) 120 MG CR tablet Reorder    Meds ordered this encounter  Medications   verapamil (CALAN-SR) 180 MG CR tablet    Sig: Take 1 tablet (180 mg total) by mouth every morning.    Dispense:  90 tablet    Refill:  3   Orders Placed This Encounter  Procedures   EKG 12-Lead   Recommendations:   Anna Mccullough is a 56 y.o.  Caucasian female patient, who is a captain in the [employer] has noticed worsening symptoms of  palpitations, markedly elevated blood pressure especially during stress at her workplace.   She also has mild hyperglycemia and hypercholesterolemia.  She was seen by me about 3 months ago, initially tried propanolol which she did not tolerate however was switched over to verapamil.  She is now tolerating this with complete resolution of symptoms of palpitations and blood pressure is also significantly improved.  I reviewed the results of the echocardiogram and the exercise stress test which are essentially low risk and normal.  With regard to hypertension, as her blood pressure is greater than 80 mmHg diastolic, I have increased the dose of verapamil from 120 mg to 180 mg daily.  Lipids are well controlled, EKG is normal, and as she remains asymptomatic with the above medical therapy, I will see her back on a as needed basis.       Adrian Prows, MD, [facility] 06/12/2022, 1:34 PM Office: [phone]

## 2022-08-06 ENCOUNTER — Other Ambulatory Visit: Payer: Self-pay | Admitting: Cardiology

## 2022-08-06 DIAGNOSIS — R002 Palpitations: Secondary | ICD-10-CM

## 2022-08-06 DIAGNOSIS — I10 Essential (primary) hypertension: Secondary | ICD-10-CM

## 2022-09-13 ENCOUNTER — Other Ambulatory Visit: Payer: Self-pay | Admitting: Obstetrics and Gynecology

## 2022-09-13 NOTE — Telephone Encounter (Signed)
Please let patient know that I recommend stopping her birth control pills 2 weeks prior to her office visit so we can measure her hormone levels.  We can then determine if she needs to continue with the medication.

## 2022-09-13 NOTE — Telephone Encounter (Signed)
Last AEX 10/07/2021--scheduled for 10/11/2022 Last mammo 11/14/2021-birads 2 benign

## 2022-09-13 NOTE — Telephone Encounter (Signed)
Pt notified and voiced understanding 

## 2022-09-28 NOTE — Progress Notes (Signed)
57 y.o. G61P2002 Married Caucasian female here for annual exam.    Being treated for HTN.  New work position is less stressful.  She stopped her birth control at the end of December.  No period for 6 - 8 months.  Hot flashes were prominent last spring. Sleeping better now.   Wants help with weight loss beyond dietary recommendations.   Does not need pregnancy prevention.  Not sexually active.  Does want birth control pills to regular menstrual cycles.   Retiring in June.   PCP:  Howard Pouch, DO  No LMP recorded. (Menstrual status: Irregular Periods).     Period Pattern: (!) Irregular Menstrual Flow: Moderate Pt has not had a period in 6-8 months.     Sexually active: No.  The current method of family planning is none.    Exercising: No.     Smoker:  no  Health Maintenance: Pap:  10/07/21 negative: HR HPV negative, 06/15/17 negative: HR HPV negative History of abnormal Pap:  yes MMG:  11/16/21 BI-RADS CATEGORY 2 Benign.  Has appointment.  Colonoscopy:  n/a BMD:   n/a  Result  n/a TDaP:  01/19/20 Gardasil:   no HIV: 11/18/20 NR Hep C: 11/18/20 Neg Screening Labs:  PCP and cardiology. Flu vaccine:  completed Covid vaccine:  discussed   reports that she has never smoked. She has never used smokeless tobacco. She reports current alcohol use of about 1.0 standard drink of alcohol per week. She reports that she does not use drugs.  Past Medical History:  Diagnosis Date   Acute pain of right knee 10/23/2019   ASCUS on Pap smear 2009   C&B 2009-BENIGN   Cervical radiculopathy 10/23/2019   c6-7 mild degenerative changes.    HTN (hypertension)    Hyperlipidemia    Migraine    Nephrolithiasis    Rabies exposure 02/02/2019    Past Surgical History:  Procedure Laterality Date   BASAL CELL CARCINOMA EXCISION     CESAREAN SECTION     CESAREAN SECTION  1999   CESAREAN SECTION  2004   COLPOSCOPY  2009   B9   KIDNEY SURGERY     REMOVAL OF KIDNEY STONES X 2   KNEE SURGERY   1988   RIGHT KNEE SURGERY    Current Outpatient Medications  Medication Sig Dispense Refill   atorvastatin (LIPITOR) 10 MG tablet Take 1 tablet (10 mg total) by mouth daily. 90 tablet 4   b complex vitamins capsule Take 1 capsule by mouth daily.     meloxicam (MOBIC) 15 MG tablet Take 15 mg by mouth as needed for pain.     norethindrone (MICRONOR) 0.35 MG tablet TAKE 1 TABLET BY MOUTH EVERY DAY 28 tablet 0   Probiotic Product (PROBIOTIC PO) Take by mouth.     verapamil (CALAN-SR) 180 MG CR tablet Take 1 tablet (180 mg total) by mouth every morning. 90 tablet 3   No current facility-administered medications for this visit.    Family History  Problem Relation Age of Onset   Hypertension Mother    Cancer Father        prostate   Hypertension Father    Heart attack Father    Hyperlipidemia Father    Kidney Stones Father    Leukemia Paternal Grandfather    Stroke Maternal Grandmother    Heart attack Maternal Grandfather     Review of Systems  All other systems reviewed and are negative.   Exam:   BP 124/84 (BP  Location: Right Arm, Patient Position: Sitting, Cuff Size: Normal)   Pulse 68   Ht 5\' 5"  (1.651 m)   Wt 166 lb (75.3 kg)   SpO2 98%   BMI 27.62 kg/m     General appearance: alert, cooperative and appears stated age Head: normocephalic, without obvious abnormality, atraumatic Neck: no adenopathy, supple, symmetrical, trachea midline and thyroid normal to inspection and palpation Lungs: clear to auscultation bilaterally Breasts: normal appearance, no masses or tenderness, No nipple retraction or dimpling, No nipple discharge or bleeding, No axillary adenopathy Heart: regular rate and rhythm Abdomen: soft, non-tender; no masses, no organomegaly Extremities: extremities normal, atraumatic, no cyanosis or edema Skin: skin color, texture, turgor normal. No rashes or lesions Lymph nodes: cervical, supraclavicular, and axillary nodes normal. Neurologic: grossly  normal  Pelvic: External genitalia:  no lesions              No abnormal inguinal nodes palpated.              Urethra:  normal appearing urethra with no masses, tenderness or lesions              Bartholins and Skenes: normal                 Vagina: normal appearing vagina with normal color and discharge, no lesions              Cervix: no lesions              Pap taken: no Bimanual Exam:  Uterus:  normal size, contour, position, consistency, mobility, non-tender              Adnexa: no mass, fullness, tenderness              Rectal exam: yes.  Confirms.              Anus:  normal sphincter tone, no lesions  Chaperone was present for exam:  Raquel Sarna.  Assessment:   Well woman visit with gynecologic exam. Amenorrhea.   Weight gain.  Colon cancer screening.  HTN.  Elevated cholesterol.  Plan: Perimenopausal female.  On combined oral contraception.  Elevated blood pressure reading.  Weight gain. Mammogram screening discussed. Self breast awareness reviewed. Pap and HR HPV in 2028.  Guidelines for Calcium, Vitamin D, regular exercise program including cardiovascular and weight bearing exercise. Will check FSH and estradiol.  If not menopausal, will give refill of Micronor for one year.   I recommended she contact [facility].  Follow up annually and prn.   After visit summary provided.

## 2022-10-06 ENCOUNTER — Other Ambulatory Visit: Payer: Self-pay | Admitting: Obstetrics and Gynecology

## 2022-10-09 NOTE — Telephone Encounter (Signed)
Med refill request:Norethindrone 0.35mg   Last AEX: 10/07/21 -BS Next AEX: 10/11/22 -BS Last MMG (if hormonal med) [facility] 11/14/21; Neg   Call to patient. Previously advised to hold medication prior to AEX for labs. No RX needed at this time, will discuss with Dr. Quincy Simmonds at [facility] 10/11/22.   Rx refused.   Routing to provider for final review. Patient is agreeable to disposition. Will close encounter.

## 2022-10-10 ENCOUNTER — Other Ambulatory Visit: Payer: Self-pay | Admitting: Obstetrics and Gynecology

## 2022-10-10 NOTE — Telephone Encounter (Signed)
See 10/06/22 refill encounter.   Rx refused.   Routing to provider for final review. Will close encounter.

## 2022-10-11 ENCOUNTER — Encounter: Payer: Self-pay | Admitting: Obstetrics and Gynecology

## 2022-10-11 ENCOUNTER — Ambulatory Visit (INDEPENDENT_AMBULATORY_CARE_PROVIDER_SITE_OTHER): Payer: Managed Care, Other (non HMO) | Admitting: Obstetrics and Gynecology

## 2022-10-11 VITALS — BP 124/84 | HR 68 | Ht 65.0 in | Wt 166.0 lb

## 2022-10-11 DIAGNOSIS — N912 Amenorrhea, unspecified: Secondary | ICD-10-CM

## 2022-10-11 DIAGNOSIS — Z01419 Encounter for gynecological examination (general) (routine) without abnormal findings: Secondary | ICD-10-CM | POA: Diagnosis not present

## 2022-10-11 NOTE — Patient Instructions (Signed)

## 2022-10-12 ENCOUNTER — Other Ambulatory Visit: Payer: Self-pay | Admitting: Obstetrics and Gynecology

## 2022-10-12 LAB — ESTRADIOL: Estradiol: 286 pg/mL

## 2022-10-12 LAB — FOLLICLE STIMULATING HORMONE: FSH: 10.9 m[IU]/mL

## 2022-10-12 MED ORDER — NORETHINDRONE 0.35 MG PO TABS: 1.0000 | ORAL_TABLET | Freq: Every day | ORAL | 3 refills | Status: DC

## 2022-11-21 ENCOUNTER — Ambulatory Visit (INDEPENDENT_AMBULATORY_CARE_PROVIDER_SITE_OTHER): Payer: Managed Care, Other (non HMO) | Admitting: Family Medicine

## 2022-11-21 ENCOUNTER — Encounter: Payer: Self-pay | Admitting: Family Medicine

## 2022-11-21 VITALS — BP 120/84 | HR 70 | Temp 97.9°F | Ht 66.14 in | Wt 172.4 lb

## 2022-11-21 DIAGNOSIS — Z130 Encounter for screening for diseases of the blood and blood-forming organs and certain disorders involving the immune mechanism: Secondary | ICD-10-CM

## 2022-11-21 DIAGNOSIS — Z0000 Encounter for general adult medical examination without abnormal findings: Secondary | ICD-10-CM

## 2022-11-21 DIAGNOSIS — Z131 Encounter for screening for diabetes mellitus: Secondary | ICD-10-CM

## 2022-11-21 DIAGNOSIS — E782 Mixed hyperlipidemia: Secondary | ICD-10-CM

## 2022-11-21 LAB — CBC WITH DIFFERENTIAL/PLATELET
Basophils Absolute: 0 10*3/uL (ref 0.0–0.1)
Basophils Relative: 0.5 % (ref 0.0–3.0)
Eosinophils Absolute: 0.1 10*3/uL (ref 0.0–0.7)
Eosinophils Relative: 2 % (ref 0.0–5.0)
HCT: 42.7 % (ref 36.0–46.0)
Hemoglobin: 14.3 g/dL (ref 12.0–15.0)
Lymphocytes Relative: 37.2 % (ref 12.0–46.0)
Lymphs Abs: 2.8 10*3/uL (ref 0.7–4.0)
MCHC: 33.5 g/dL (ref 30.0–36.0)
MCV: 91 fl (ref 78.0–100.0)
Monocytes Absolute: 0.6 10*3/uL (ref 0.1–1.0)
Monocytes Relative: 7.5 % (ref 3.0–12.0)
Neutro Abs: 3.9 10*3/uL (ref 1.4–7.7)
Neutrophils Relative %: 52.8 % (ref 43.0–77.0)
Platelets: 254 10*3/uL (ref 150.0–400.0)
RBC: 4.7 Mil/uL (ref 3.87–5.11)
RDW: 13.3 % (ref 11.5–15.5)
WBC: 7.4 10*3/uL (ref 4.0–10.5)

## 2022-11-21 LAB — COMPREHENSIVE METABOLIC PANEL
ALT: 20 U/L (ref 0–35)
AST: 21 U/L (ref 0–37)
Albumin: 4.2 g/dL (ref 3.5–5.2)
Alkaline Phosphatase: 81 U/L (ref 39–117)
BUN: 24 mg/dL — ABNORMAL HIGH (ref 6–23)
CO2: 31 mEq/L (ref 19–32)
Calcium: 9.8 mg/dL (ref 8.4–10.5)
Chloride: 101 mEq/L (ref 96–112)
Creatinine, Ser: 1.08 mg/dL (ref 0.40–1.20)
GFR: 58.24 mL/min — ABNORMAL LOW (ref >60.00)
Glucose, Bld: 85 mg/dL (ref 70–99)
Potassium: 4.5 mEq/L (ref 3.5–5.1)
Sodium: 140 mEq/L (ref 135–145)
Total Bilirubin: 0.5 mg/dL (ref 0.2–1.2)
Total Protein: 6.6 g/dL (ref 6.0–8.3)

## 2022-11-21 LAB — LIPID PANEL
Cholesterol: 167 mg/dL (ref 0–200)
HDL: 60.4 mg/dL (ref >39.00)
LDL Cholesterol: 92 mg/dL (ref 0–99)
NonHDL: 107.07
Total CHOL/HDL Ratio: 3
Triglycerides: 74 mg/dL (ref 0.0–149.0)
VLDL: 14.8 mg/dL (ref 0.0–40.0)

## 2022-11-21 LAB — TSH: TSH: 2.17 u[IU]/mL (ref 0.35–5.50)

## 2022-11-21 LAB — HEMOGLOBIN A1C: Hgb A1c MFr Bld: 5.9 % (ref 4.6–6.5)

## 2022-11-21 MED ORDER — ATORVASTATIN CALCIUM 10 MG PO TABS: 10.0000 mg | ORAL_TABLET | Freq: Every day | ORAL | 4 refills | Status: DC

## 2022-11-21 NOTE — Patient Instructions (Signed)
No follow-ups on file.        Great to see you today.  I have refilled the medication(s) we provide.   If labs were collected, we will inform you of lab results once received either by echart message or telephone call.   - echart message- for normal results that have been seen by the patient already.   - telephone call: abnormal results or if patient has not viewed results in their echart.  

## 2022-11-21 NOTE — Progress Notes (Signed)
Patient ID: Anna Mccullough, female  DOB: November 27, 1965, 57 y.o.   MRN: [id number] Patient Care Team    Relationship Specialty Notifications Start End  Ma Hillock, DO PCP - General Family Medicine  10/20/19   Latanya Maudlin, MD Consulting Physician Orthopedic Surgery  10/23/19   Sharyne Peach, MD Consulting Physician Ophthalmology  10/23/19   Joseph Pierini, MD (Inactive) Consulting Physician Obstetrics and Gynecology  11/18/20   Vinnie Level, NP Nurse Practitioner Orthopedic Surgery  11/18/20     Chief Complaint  Patient presents with   Annual Exam    [facility]; Pt is fasting    Subjective: Anna Mccullough is a 57 y.o.  Female  present for CPE. All past medical history, surgical history, allergies, family history, immunizations, medications and social history were updated in the electronic medical record today. All recent labs, ED visits and hospitalizations within the last year were reviewed.  Health maintenance:  Colon cancer screen: declined again today- offered cologuard and declined. She will think about it. Ok to place order for either if calls in .  Mammogram: completed 10/2021 [facility]. > ordered by Dr. Quincy Simmonds at [facility] Cervical cancer screening: last pap: 2022, Dr. Quincy Simmonds Immunizations: tdap UTD 2021, Influenza (encouraged yearly), zostavax - declined  Infectious disease screening: HIV and  Hep C  completed DeXA: routine screen Patient has a Dental home. Hospitalizations/ED visits: reviewed     11/21/2022    8:59 AM 11/21/2021   11:04 AM 11/18/2020    9:24 AM 10/20/2019    2:33 PM  Depression screen PHQ 2/9  Decreased Interest 0 0 0 0  Down, Depressed, Hopeless 0 0 0 0  PHQ - 2 Score 0 0 0 0      11/21/2022    8:59 AM  GAD 7 : Generalized Anxiety Score  Nervous, Anxious, on Edge 0  Control/stop worrying 0  Worry too much - different things 0  Trouble relaxing 0  Restless 0  Easily annoyed or irritable 0  Afraid -  awful might happen 0  Total GAD 7 Score 0     Immunization History  Administered Date(s) Administered   Influenza Inj Mdck Quad Pf 06/24/2019   Influenza,inj,Quad PF,6+ Mos 06/15/2017, 07/11/2021, 06/25/2022   Influenza-Unspecified 07/24/2020   PFIZER(Purple Top)SARS-COV-2 Vaccination 10/23/2019, 11/14/2019, 06/22/2020   Rabies, IM 01/30/2019, 01/30/2019, 02/02/2019, 02/06/2019, 02/13/2019   Tdap 01/19/2020    Past Medical History:  Diagnosis Date   Acute pain of right knee 10/23/2019   Arthritis    ASCUS on Pap smear 2009   C&B 2009-BENIGN   Cervical radiculopathy 10/23/2019   c6-7 mild degenerative changes.    HTN (hypertension)    Hyperlipidemia    Migraine    Nephrolithiasis    Rabies exposure 02/02/2019   No Known Allergies Past Surgical History:  Procedure Laterality Date   BASAL CELL CARCINOMA EXCISION     CESAREAN SECTION     CESAREAN SECTION  1999   CESAREAN SECTION  2004   COLPOSCOPY  2009   B9   KIDNEY SURGERY     REMOVAL OF KIDNEY STONES X 2   KNEE SURGERY  1988   RIGHT KNEE SURGERY   Family History  Problem Relation Age of Onset   Hypertension Mother    Cancer Father        prostate   Hypertension Father    Heart attack Father    Hyperlipidemia Father    Kidney Stones  Father    Leukemia Paternal Grandfather    Stroke Maternal Grandmother    Heart attack Maternal Grandfather    Social History   Social History Narrative   Marital status/children/pets: Married.  2 children.   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   Safety:      -smoke alarm in the home:Yes     - wears seatbelt: Yes     - Feels safe in their relationships: Yes    Allergies as of 11/21/2022   No Known Allergies      Medication List        Accurate as of November 21, 2022  9:08 AM. If you have any questions, ask your nurse or doctor.          STOP taking these medications    b complex vitamins capsule Stopped by: Howard Pouch, DO   meloxicam 15 MG tablet Commonly known as: MOBIC Stopped by: Howard Pouch, DO       TAKE these medications    atorvastatin 10 MG tablet Commonly known as: LIPITOR Take 1 tablet (10 mg total) by mouth daily.   multivitamin tablet Take 1 tablet by mouth daily.   norethindrone 0.35 MG tablet Commonly known as: MICRONOR Take 1 tablet (0.35 mg total) by mouth daily.   PROBIOTIC PO Take by mouth.   verapamil 180 MG CR tablet Commonly known as: CALAN-SR Take 1 tablet (180 mg total) by mouth every morning.   VITAMIN D (CHOLECALCIFEROL) PO Take by mouth.        All past medical history, surgical history, allergies, family history, immunizations andmedications were updated in the EMR today and reviewed under the history and medication portions of their EMR.      No results found.   ROS 14 pt review of systems performed and negative (unless mentioned in an HPI)  Objective: BP 120/84   Pulse 70   Temp 97.9 F (36.6 C)   Ht 5' 6.14" (1.68 m)   Wt 172 lb 6.4 oz (78.2 kg)   SpO2 100%   BMI 27.71 kg/m  Physical Exam Vitals and nursing note reviewed.  Constitutional:      General: She is not in acute distress.    Appearance: Normal appearance. She is not ill-appearing or toxic-appearing.  HENT:     Head: Normocephalic and atraumatic.     Right Ear: Tympanic membrane, ear canal and external ear normal. There is no impacted cerumen.     Left Ear: Tympanic membrane, ear canal and external ear normal. There is no impacted cerumen.     Nose: No congestion or rhinorrhea.     Mouth/Throat:     Mouth: Mucous membranes are moist.     Pharynx: Oropharynx is clear. No oropharyngeal exudate or posterior oropharyngeal erythema.  Eyes:     General: No scleral icterus.       Right eye: No discharge.        Left eye: No discharge.     Extraocular Movements: Extraocular movements intact.     Conjunctiva/sclera: Conjunctivae normal.     Pupils: Pupils are equal,  round, and reactive to light.  Cardiovascular:     Rate and Rhythm: Normal rate and regular rhythm.     Pulses: Normal pulses.     Heart sounds: Normal heart sounds. No murmur heard.    No friction rub. No gallop.  Pulmonary:     Effort: Pulmonary effort is normal. No respiratory distress.  Breath sounds: Normal breath sounds. No stridor. No wheezing, rhonchi or rales.  Chest:     Chest wall: No tenderness.  Abdominal:     General: Abdomen is flat. Bowel sounds are normal. There is no distension.     Palpations: Abdomen is soft. There is no mass.     Tenderness: There is no abdominal tenderness. There is no right CVA tenderness, left CVA tenderness, guarding or rebound.     Hernia: No hernia is present.  Musculoskeletal:        General: No swelling, tenderness or deformity. Normal range of motion.     Cervical back: Normal range of motion and neck supple. No rigidity or tenderness.     Right lower leg: No edema.     Left lower leg: No edema.  Lymphadenopathy:     Cervical: No cervical adenopathy.  Skin:    General: Skin is warm and dry.     Coloration: Skin is not jaundiced or pale.     Findings: No bruising, erythema, lesion or rash.  Neurological:     General: No focal deficit present.     Mental Status: She is alert and oriented to person, place, and time. Mental status is at baseline.     Cranial Nerves: No cranial nerve deficit.     Sensory: No sensory deficit.     Motor: No weakness.     Coordination: Coordination normal.     Gait: Gait normal.     Deep Tendon Reflexes: Reflexes normal.  Psychiatric:        Mood and Affect: Mood normal.        Behavior: Behavior normal.        Thought Content: Thought content normal.        Judgment: Judgment normal.     No results found.  Assessment/plan: Anna Mccullough is a 57 y.o. female present for CPE  Mixed hyperlipidemia Continue lipitor 10 mg qd Been on propranolol for tachycardia in the past, now prescribed  verapamil by cardiology Lipids, CMP and TSH collected today  Routine general medical examination at a health care facility Patient was encouraged to exercise greater than 150 minutes a week. Patient was encouraged to choose a diet filled with fresh fruits and vegetables, and lean meats. AVS provided to patient today for education/recommendation on gender specific health and safety maintenance. Colon cancer screen: declined again today- offered cologuard and declined. She will think about it. Ok to place order for either if calls in .  Mammogram: completed 10/2021 [facility]. > ordered by Dr. Quincy Simmonds at [facility] Cervical cancer screening: last pap: 2022, Dr. Quincy Simmonds Immunizations: tdap UTD 2021, Influenza (encouraged yearly), zostavax - declined  Infectious disease screening: HIV and  Hep C  completed DeXA: routine screen  Return in about 1 year (around 11/23/2023) for cpe (20 min).  Orders Placed This Encounter  Procedures   CBC with Differential/Platelet   Comprehensive metabolic panel   Hemoglobin A1c   Lipid panel   TSH   Meds ordered this encounter  Medications   atorvastatin (LIPITOR) 10 MG tablet    Sig: Take 1 tablet (10 mg total) by mouth daily.    Dispense:  90 tablet    Refill:  4   Referral Orders  No referral(s) requested today   Electronically signed by: Howard Pouch, [place]

## 2022-11-22 ENCOUNTER — Telehealth: Payer: Self-pay | Admitting: Family Medicine

## 2022-11-22 DIAGNOSIS — R944 Abnormal results of kidney function studies: Secondary | ICD-10-CM

## 2022-11-22 NOTE — Telephone Encounter (Signed)
Please call patient Liver and thyroid function are normal Blood cell counts and electrolytes are normal Diabetes screening/A1c is normal at 5.9 Cholesterol panel is at goal. LDL at 92  Kidney function is just mildly lower than her normal.  Likely secondary to fasting state with mild dehydration.  However, since that is new for her, I would recommend she repeat the kidney function test in 2 weeks by lab appointment only and make sure she is hydrating well prior to appointment.

## 2022-11-22 NOTE — Telephone Encounter (Signed)
CPE forms placed on PCP desk for signature

## 2022-11-23 NOTE — Telephone Encounter (Signed)
Faxed and placed in folder to go to [place]

## 2022-11-23 NOTE — Telephone Encounter (Signed)
Completed and returned to CMA work basket ?

## 2022-12-13 ENCOUNTER — Encounter: Payer: Self-pay | Admitting: Family Medicine

## 2023-01-12 ENCOUNTER — Encounter: Payer: Self-pay | Admitting: Obstetrics and Gynecology

## 2023-01-12 NOTE — Telephone Encounter (Signed)
Please advise before sending request for an office visit to the appt desk. Thanks.

## 2023-01-22 NOTE — Telephone Encounter (Signed)
Will route to provider for final review and close encounter.  

## 2023-02-05 ENCOUNTER — Ambulatory Visit: Payer: Managed Care, Other (non HMO) | Admitting: Obstetrics and Gynecology

## 2023-05-23 ENCOUNTER — Other Ambulatory Visit: Payer: Self-pay

## 2023-05-23 DIAGNOSIS — R002 Palpitations: Secondary | ICD-10-CM

## 2023-05-23 DIAGNOSIS — I10 Essential (primary) hypertension: Secondary | ICD-10-CM

## 2023-05-23 MED ORDER — VERAPAMIL HCL ER 180 MG PO TBCR: 180.0000 mg | EXTENDED_RELEASE_TABLET | Freq: Every morning | ORAL | 0 refills | Status: DC

## 2023-06-18 ENCOUNTER — Encounter: Payer: Self-pay | Admitting: Cardiology

## 2023-06-18 ENCOUNTER — Telehealth: Payer: Self-pay | Admitting: Cardiology

## 2023-06-18 DIAGNOSIS — R002 Palpitations: Secondary | ICD-10-CM

## 2023-06-18 DIAGNOSIS — I10 Essential (primary) hypertension: Secondary | ICD-10-CM

## 2023-06-18 NOTE — Telephone Encounter (Signed)
I spoke with patient regarding refill of Verapamil.  See phone note. Patient reports since mid August she has been having chest pain only at night.  Happens about twice per week.  Is a stabbing type pain that lasts 5-10 minutes.  She gets up and drinks water and tries to relax.  This seems to help the pain. Pain only occurs at night. No pain with exertion. No shortness of breath.  She is wondering if it could be reflux.  She has not tried any medication for the pain.  I told her she could try TUMS or another antacid to see if this helps.  I asked her to contact office if pain changes or is related to exertion.

## 2023-06-18 NOTE — Telephone Encounter (Signed)
Patient called in rescheduling appointment with Dr. Jacinto Halim that was cancelled due to the schedule changes.   She was very upset that her appointment was reschedule in December and refused to reschedule with a PA or NP.   Patient would like callback on what to do in the meantime regarding her BP meds due to that being the reason for this appointment.   Patient has been added to the wait list incase of a cancellation.  Please advise.

## 2023-06-18 NOTE — Telephone Encounter (Signed)
I spoke with patient. She needs refill of Verapamil 180 mg daily sent to [facility] in [place] [facility].  Chart reviewed and refill was sent in on August 28.  Patient reports she has not received message that medication is ready.  I told patient I would check with pharmacy and then send her message through my chart.  I spoke with [facility] and confirmed they have the refill.  They will get medication ready for patient. Message sent to patient through my chart

## 2023-06-20 MED ORDER — VERAPAMIL HCL ER 180 MG PO TBCR: 180.0000 mg | EXTENDED_RELEASE_TABLET | Freq: Every morning | ORAL | 1 refills | Status: DC

## 2023-06-20 NOTE — Telephone Encounter (Signed)
ICD-10-CM   1. Palpitations  R00.2 verapamil (CALAN-SR) 180 MG CR tablet    2. Primary hypertension  I10 verapamil (CALAN-SR) 180 MG CR tablet     Meds ordered this encounter  Medications   verapamil (CALAN-SR) 180 MG CR tablet    Sig: Take 1 tablet (180 mg total) by mouth every morning.    Dispense:  90 tablet    Refill:  1    Medications Discontinued During This Encounter  Medication Reason   verapamil (CALAN-SR) 180 MG CR tablet

## 2023-06-28 ENCOUNTER — Ambulatory Visit: Payer: Managed Care, Other (non HMO) | Admitting: Cardiology

## 2023-07-10 ENCOUNTER — Encounter: Payer: Self-pay | Admitting: Cardiology

## 2023-07-10 ENCOUNTER — Ambulatory Visit: Payer: Managed Care, Other (non HMO) | Attending: Cardiology | Admitting: Cardiology

## 2023-07-10 VITALS — BP 120/80 | HR 86 | Resp 16 | Ht 66.0 in | Wt 165.2 lb

## 2023-07-10 DIAGNOSIS — I10 Essential (primary) hypertension: Secondary | ICD-10-CM

## 2023-07-10 DIAGNOSIS — E7800 Pure hypercholesterolemia, unspecified: Secondary | ICD-10-CM | POA: Diagnosis not present

## 2023-07-10 DIAGNOSIS — R002 Palpitations: Secondary | ICD-10-CM | POA: Diagnosis not present

## 2023-07-10 NOTE — Patient Instructions (Signed)
Medication Instructions:  Your physician recommends that you continue on your current medications as directed. Please refer to the Current Medication list given to you today.  *If you need a refill on your cardiac medications before your next appointment, please call your pharmacy*  Lab Work: None ordered today. If you have labs (blood work) drawn today and your tests are completely normal, you will receive your results only by: MyChart Message (if you have MyChart) OR A paper copy in the mail If you have any lab test that is abnormal or we need to change your treatment, we will call you to review the results.  Testing/Procedures: None ordered  Follow-Up: As needed

## 2023-07-10 NOTE — Progress Notes (Signed)
Cardiology Office Note:  .   Date:  07/10/2023  ID:  Anna Mccullough, DOB 21-Jul-1966, MRN [id number] PCP: Natalia Leatherwood, DO  [facility] Providers Cardiologist:  None    History of Present Illness: Anna Mccullough   Anna Mccullough is a 57 y.o. Caucasian female patient, who is a Biochemist, clinical in the [employer] has chronic palpitations, markedly elevated blood pressure especially during stress at her workplace, mild hyperglycemia and hypercholesterolemia. I had seen her 1 year ago and now presents for medication management.   Discussed the use of AI scribe software for clinical note transcription with the patient, who gave verbal consent to proceed.  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 The pain is located in the center of the chest and has been more frequent since August. The patient drinks wine three to four times a week and exercises regularly. She has a history of hypertension, controlled with verapamil, and a history of kidney stones. The patient has not had any recent issues with kidney stones and has increased her water intake. The patient also reports feeling a bit anxious since retiring, as she has always been used to working full time and being in constant motion.      Review of Systems  Cardiovascular:  Negative for chest pain, dyspnea on exertion and leg swelling.   Risk Assessment/Calculations:     Lab Results  Component Value Date   CHOL 167 11/21/2022   HDL 60.40 11/21/2022   LDLCALC 92 11/21/2022   TRIG 74.0 11/21/2022   CHOLHDL 3 11/21/2022   Lab Results  Component Value Date   NA 140 11/21/2022   K 4.5 11/21/2022   CO2 31 11/21/2022   GLUCOSE 85 11/21/2022   BUN 24 (H) 11/21/2022   CREATININE 1.08 11/21/2022   CALCIUM 9.8 11/21/2022   GFR 58.24 (L) 11/21/2022   Lab Results  Component Value Date   WBC  7.4 11/21/2022   HGB 14.3 11/21/2022   HCT 42.7 11/21/2022   MCV 91.0 11/21/2022   PLT 254.0 11/21/2022    Physical Exam:   VS:  BP 120/80 (BP Location: Left Arm, Patient Position: Sitting, Cuff Size: Normal)   Pulse 86   Resp 16   Ht 5\' 6"  (1.676 m)   Wt 165 lb 3.2 oz (74.9 kg)   SpO2 98%   BMI 26.66 kg/m    Wt Readings from Last 3 Encounters:  07/10/23 165 lb 3.2 oz (74.9 kg)  11/21/22 172 lb 6.4 oz (78.2 kg)  10/11/22 166 lb (75.3 kg)     Physical Exam Neck:     Vascular: No carotid bruit or JVD.  Cardiovascular:     Rate and Rhythm: Normal rate and regular rhythm.     Pulses: Intact distal pulses.     Heart sounds: Normal heart sounds. No murmur heard.    No gallop.  Pulmonary:     Effort: Pulmonary effort is normal.     Breath sounds: Normal breath sounds.  Abdominal:     General: Bowel sounds are normal.     Palpations: Abdomen is soft.  Musculoskeletal:     Right lower leg: No edema.     Left lower leg: No edema.    Studies Reviewed: Anna Mccullough    EKG:    EKG Interpretation Date/Time:  Tuesday July 10 2023 10:35:03 EDT Ventricular Rate:  76 PR Interval:  140 QRS Duration:  78 QT Interval:  388 QTC Calculation: 436 R Axis:   78  Text Interpretation: EKG 07/10/2023: Normal sinus rhythm at the rate of 76 bpm, normal axis, incomplete right bundle branch block.  Normal EKG. Confirmed by Delrae Rend [id number] on 07/10/2023 10:48:37 AM    Exercise treadmill stress test 01/20/2022: Exercise treadmill stress test performed using Bruce protocol.  Patient reached 10.1 METS, and 100% of age predicted maximum heart rate.  Exercise capacity was normal.  No chest pain reported.  Normal heart rate and hemodynamic response. Stress EKG revealed no ischemic changes. Low risk study.   Echocardiogram 01/20/2022: Left ventricle cavity is normal in size and wall thickness. Normal global wall motion. Normal LV systolic function with EF 59%. Normal diastolic filling  pattern. Mild (Grade I) mitral regurgitation. Normal right atrial pressure.  ASSESSMENT AND PLAN: .      ICD-10-CM   1. Palpitations  R00.2 EKG 12-Lead    2. Primary hypertension  I10     3. Hypercholesteremia  E78.00       Assessment and Plan    Chest Pain Infrequent episodes, predominantly at night, possibly associated with wine consumption. Differential includes acid reflux. No associated exercise intolerance. -Continue current management.  Hypertension Well controlled on Verapamil. -Continue Verapamil. -Check blood pressure periodically at home to ensure diastolic remains below 80.  Kidney Stones No recent issues, history of frequent stones possibly associated with iced tea consumption. -Continue current management, including increased water intake and decreased iced tea consumption.  General Health Maintenance -Transfer prescription management to primary care provider. -No further cardiology follow-up required unless new issues arise.   With regard to hyperglycemia, she is presently on a 10 mg of Lipitor with excellent control of LDL, LDL goal <100.  No changes in the medications were noted.  From cardiac standpoint she remains stable, I will see her back on a as needed basis.  I will request her PCP to take over her prescriptions.  Signed,  Yates Decamp, MD, [facility] 07/10/2023, 11:04 AM [facility] [address] [place], [place] [address] Phone: [phone]. Fax:  [phone]

## 2023-08-29 ENCOUNTER — Ambulatory Visit: Payer: Managed Care, Other (non HMO) | Admitting: Cardiology

## 2023-09-16 ENCOUNTER — Other Ambulatory Visit: Payer: Self-pay | Admitting: Obstetrics and Gynecology

## 2023-09-17 NOTE — Telephone Encounter (Signed)
Med refill request: [facility] Last AEX: 10/11/2022 Next AEX: not yet scheduled Last MMG (if hormonal med) 11/16/2021 Refill authorized: Last Rx was sent #[id number] with 3 refills on 10/12/2022. Please approve or deny as appropriate.

## 2023-11-21 LAB — HM MAMMOGRAPHY

## 2023-11-23 ENCOUNTER — Ambulatory Visit (INDEPENDENT_AMBULATORY_CARE_PROVIDER_SITE_OTHER): Payer: Managed Care, Other (non HMO) | Admitting: Family Medicine

## 2023-11-23 ENCOUNTER — Encounter: Payer: Self-pay | Admitting: Family Medicine

## 2023-11-23 VITALS — BP 122/80 | HR 78 | Temp 98.2°F | Ht 67.0 in | Wt 166.2 lb

## 2023-11-23 DIAGNOSIS — E782 Mixed hyperlipidemia: Secondary | ICD-10-CM | POA: Diagnosis not present

## 2023-11-23 DIAGNOSIS — Z131 Encounter for screening for diabetes mellitus: Secondary | ICD-10-CM

## 2023-11-23 DIAGNOSIS — I10 Essential (primary) hypertension: Secondary | ICD-10-CM | POA: Diagnosis not present

## 2023-11-23 DIAGNOSIS — Z1231 Encounter for screening mammogram for malignant neoplasm of breast: Secondary | ICD-10-CM | POA: Diagnosis not present

## 2023-11-23 DIAGNOSIS — E663 Overweight: Secondary | ICD-10-CM

## 2023-11-23 DIAGNOSIS — Z23 Encounter for immunization: Secondary | ICD-10-CM

## 2023-11-23 DIAGNOSIS — R002 Palpitations: Secondary | ICD-10-CM | POA: Diagnosis not present

## 2023-11-23 DIAGNOSIS — R7309 Other abnormal glucose: Secondary | ICD-10-CM

## 2023-11-23 DIAGNOSIS — Z1211 Encounter for screening for malignant neoplasm of colon: Secondary | ICD-10-CM

## 2023-11-23 DIAGNOSIS — Z0000 Encounter for general adult medical examination without abnormal findings: Secondary | ICD-10-CM | POA: Diagnosis not present

## 2023-11-23 HISTORY — DX: Other abnormal glucose: R73.09

## 2023-11-23 LAB — COMPREHENSIVE METABOLIC PANEL
ALT: 19 U/L (ref 0–35)
AST: 20 U/L (ref 0–37)
Albumin: 4.3 g/dL (ref 3.5–5.2)
Alkaline Phosphatase: 88 U/L (ref 39–117)
BUN: 20 mg/dL (ref 6–23)
CO2: 30 meq/L (ref 19–32)
Calcium: 9.5 mg/dL (ref 8.4–10.5)
Chloride: 104 meq/L (ref 96–112)
Creatinine, Ser: 1.03 mg/dL (ref 0.40–1.20)
GFR: 61.22 mL/min (ref >60.00)
Glucose, Bld: 93 mg/dL (ref 70–99)
Potassium: 4.3 meq/L (ref 3.5–5.1)
Sodium: 141 meq/L (ref 135–145)
Total Bilirubin: 0.5 mg/dL (ref 0.2–1.2)
Total Protein: 6.6 g/dL (ref 6.0–8.3)

## 2023-11-23 LAB — CBC
HCT: 43.4 % (ref 36.0–46.0)
Hemoglobin: 14.3 g/dL (ref 12.0–15.0)
MCHC: 33.1 g/dL (ref 30.0–36.0)
MCV: 91.3 fl (ref 78.0–100.0)
Platelets: 254 10*3/uL (ref 150.0–400.0)
RBC: 4.75 Mil/uL (ref 3.87–5.11)
RDW: 13.4 % (ref 11.5–15.5)
WBC: 7.6 10*3/uL (ref 4.0–10.5)

## 2023-11-23 LAB — LIPID PANEL
Cholesterol: 175 mg/dL (ref 0–200)
HDL: 56.7 mg/dL (ref >39.00)
LDL Cholesterol: 104 mg/dL — ABNORMAL HIGH (ref 0–99)
NonHDL: 118.35
Total CHOL/HDL Ratio: 3
Triglycerides: 74 mg/dL (ref 0.0–149.0)
VLDL: 14.8 mg/dL (ref 0.0–40.0)

## 2023-11-23 LAB — HEMOGLOBIN A1C: Hgb A1c MFr Bld: 6 % (ref 4.6–6.5)

## 2023-11-23 LAB — TSH: TSH: 1.75 u[IU]/mL (ref 0.35–5.50)

## 2023-11-23 MED ORDER — ATORVASTATIN CALCIUM 10 MG PO TABS: 10.0000 mg | ORAL_TABLET | Freq: Every day | ORAL | 4 refills | Status: DC

## 2023-11-23 MED ORDER — VERAPAMIL HCL ER 180 MG PO TBCR: 180.0000 mg | EXTENDED_RELEASE_TABLET | Freq: Every morning | ORAL | 1 refills | Status: DC

## 2023-11-23 NOTE — Patient Instructions (Addendum)

## 2023-11-23 NOTE — Progress Notes (Addendum)
 Patient ID: Anna Mccullough, female  DOB: 1966-06-19, 58 y.o.   MRN: [id number] Patient Care Team    Relationship Specialty Notifications Start End  Natalia Leatherwood, DO PCP - General Family Medicine  10/20/19   Ranee Gosselin, MD Consulting Physician Orthopedic Surgery  10/23/19   Elise Benne, MD Consulting Physician Ophthalmology  10/23/19   Malcolm Metro, NP Nurse Practitioner Orthopedic Surgery  11/18/20   Patton Salles, MD Consulting Physician Obstetrics and Gynecology  11/23/23   Yates Decamp, MD Consulting Physician Cardiology  11/23/23     Chief Complaint  Patient presents with   Annual Exam    Pt is fasting.     Subjective: Anna Mccullough is a 58 y.o.  Female  present for CPE and Chronic Conditions/illness Management . All past medical history, surgical history, allergies, family history, immunizations, medications and social history were updated in the electronic medical record today. All recent labs, ED visits and hospitalizations within the last year were reviewed.  Health maintenance:  Colon cancer screen: cologuard ordered today. Mammogram: completed 10/2023.[facility]. > ordered by Dr. Edward Jolly at [facility] Cervical cancer screening: last pap: 2022, Dr. Edward Jolly Immunizations: tdap UTD 2021, Influenza UTD 2024 (encouraged yearly), zostavax -agreeable today Infectious disease screening: HIV and  Hep C  completed DeXA: routine screen Patient has a Dental home. Hospitalizations/ED visits: reviewed  Htn/palpitations//HLD Pt reports compliance with verapamil 180 mg CR every day and Lipitor 10 mg every day.  Patient denies chest pain, shortness of breath or lower extremity edema.  RF: Htn, HLD, bmi >25,.f/h of heart disease (father) Dr. Jacinto Halim told her pcp can manage these conditions moving forward 2025.      11/21/2022    8:59 AM 11/21/2021   11:04 AM 11/18/2020    9:24 AM 10/20/2019    2:33 PM  Depression  screen PHQ 2/9  Decreased Interest 0 0 0 0  Down, Depressed, Hopeless 0 0 0 0  PHQ - 2 Score 0 0 0 0      11/21/2022    8:59 AM  GAD 7 : Generalized Anxiety Score  Nervous, Anxious, on Edge 0  Control/stop worrying 0  Worry too much - different things 0  Trouble relaxing 0  Restless 0  Easily annoyed or irritable 0  Afraid - awful might happen 0  Total GAD 7 Score 0     Immunization History  Administered Date(s) Administered   Influenza Inj Mdck Quad Pf 06/24/2019   Influenza,inj,Quad PF,6+ Mos 06/15/2017, 07/11/2021, 06/25/2022   Influenza-Unspecified 07/24/2020, 05/28/2023   PFIZER(Purple Top)SARS-COV-2 Vaccination 10/23/2019, 11/14/2019, 06/22/2020   Rabies, IM 01/30/2019, 01/30/2019, 02/02/2019, 02/06/2019, 02/13/2019   Tdap 01/19/2020   Zoster Recombinant(Shingrix) 11/23/2023    Past Medical History:  Diagnosis Date   Acute pain of right knee 10/23/2019   Arthritis    ASCUS on Pap smear 2009   C&B 2009-BENIGN   Cervical radiculopathy 10/23/2019   c6-7 mild degenerative changes.    HTN (hypertension)    Hyperlipidemia    Migraine    Nephrolithiasis    Rabies exposure 02/02/2019   No Known Allergies Past Surgical History:  Procedure Laterality Date   BASAL CELL CARCINOMA EXCISION     CESAREAN SECTION     CESAREAN SECTION  1999   CESAREAN SECTION  2004   COLPOSCOPY  2009   B9   KIDNEY SURGERY     REMOVAL OF KIDNEY STONES X 2  KNEE SURGERY  1988   RIGHT KNEE SURGERY   Family History  Problem Relation Age of Onset   Hypertension Mother    Cancer Father        prostate   Hypertension Father    Heart attack Father    Hyperlipidemia Father    Kidney Stones Father    Stroke Maternal Grandmother    Heart attack Maternal Grandfather    Leukemia Paternal Grandfather    Social History   Social History Narrative   Marital status/children/pets: Married.  2 children.   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    Safety:      -smoke alarm in the home:Yes     - wears seatbelt: Yes     - Feels safe in their relationships: Yes    Allergies as of 11/23/2023   No Known Allergies      Medication List        Accurate as of November 23, 2023  9:02 AM. If you have any questions, ask your nurse or doctor.          STOP taking these medications    medium chain triglycerides oil Commonly known as: MCT OIL Stopped by: Felix Pacini       TAKE these medications    atorvastatin 10 MG tablet Commonly known as: LIPITOR Take 1 tablet (10 mg total) by mouth daily.   norethindrone 0.35 MG tablet Commonly known as: MICRONOR TAKE 1 TABLET BY MOUTH EVERY DAY   PROBIOTIC BLEND PO Take by mouth.   verapamil 180 MG CR tablet Commonly known as: CALAN-SR Take 1 tablet (180 mg total) by mouth every morning.   VITAMIN D (CHOLECALCIFEROL) PO Take 1,000 Units by mouth daily. Naturemade D3        All past medical history, surgical history, allergies, family history, immunizations andmedications were updated in the EMR today and reviewed under the history and medication portions of their EMR.      No results found.   ROS 14 pt review of systems performed and negative (unless mentioned in an HPI)  Objective: BP 122/80   Pulse 78   Temp 98.2 F (36.8 C)   Ht 5\' 7"  (1.702 m)   Wt 166 lb 3.2 oz (75.4 kg)   SpO2 99%   BMI 26.03 kg/m  Physical Exam Vitals and nursing note reviewed.  Constitutional:      General: She is not in acute distress.    Appearance: Normal appearance. She is not ill-appearing or toxic-appearing.  HENT:     Head: Normocephalic and atraumatic.     Right Ear: Tympanic membrane, ear canal and external ear normal. There is no impacted cerumen.     Left Ear: Tympanic membrane, ear canal and external ear normal. There is no impacted cerumen.     Nose: No congestion or rhinorrhea.     Mouth/Throat:     Mouth: Mucous membranes are moist.     Pharynx: Oropharynx is  clear. No oropharyngeal exudate or posterior oropharyngeal erythema.  Eyes:     General: No scleral icterus.       Right eye: No discharge.        Left eye: No discharge.     Extraocular Movements: Extraocular movements intact.     Conjunctiva/sclera: Conjunctivae normal.     Pupils: Pupils are equal, round, and reactive to light.  Cardiovascular:     Rate and Rhythm: Normal rate and regular rhythm.  Pulses: Normal pulses.     Heart sounds: Normal heart sounds. No murmur heard.    No friction rub. No gallop.  Pulmonary:     Effort: Pulmonary effort is normal. No respiratory distress.     Breath sounds: Normal breath sounds. No stridor. No wheezing, rhonchi or rales.  Chest:     Chest wall: No tenderness.  Abdominal:     General: Abdomen is flat. Bowel sounds are normal. There is no distension.     Palpations: Abdomen is soft. There is no mass.     Tenderness: There is no abdominal tenderness. There is no right CVA tenderness, left CVA tenderness, guarding or rebound.     Hernia: No hernia is present.  Musculoskeletal:        General: No swelling, tenderness or deformity. Normal range of motion.     Cervical back: Normal range of motion and neck supple. No rigidity or tenderness.     Right lower leg: No edema.     Left lower leg: No edema.  Lymphadenopathy:     Cervical: No cervical adenopathy.  Skin:    General: Skin is warm and dry.     Coloration: Skin is not jaundiced or pale.     Findings: No bruising, erythema, lesion or rash.  Neurological:     General: No focal deficit present.     Mental Status: She is alert and oriented to person, place, and time. Mental status is at baseline.     Cranial Nerves: No cranial nerve deficit.     Sensory: No sensory deficit.     Motor: No weakness.     Coordination: Coordination normal.     Gait: Gait normal.     Deep Tendon Reflexes: Reflexes normal.  Psychiatric:        Mood and Affect: Mood normal.        Behavior: Behavior  normal.        Thought Content: Thought content normal.        Judgment: Judgment normal.     No results found.  Assessment/plan: CAMILLA SKEEN is a 58 y.o. female present for CPE and Chronic Conditions/illness Management  HTN/paliptations/bmi>25/Mixed hyperlipidemia/fh heart disease/ Continue lipitor 10 mg qd Been on propranolol for tachycardia in the past, now prescribed verapamil by cardiology Lipids, cbc, cmp, tsh collected today  Diabetes mellitus screening/BMI>25 - Hemoglobin A1c Colon cancer screening - Cologuard Need for shingles vaccine - Zoster, Recombinant (Shingrix) #1 today, #2 2-6 mos.   Routine general medical examination at a health care facility Patient was encouraged to exercise greater than 150 minutes a week. Patient was encouraged to choose a diet filled with fresh fruits and vegetables, and lean meats. AVS provided to patient today for education/recommendation on gender specific health and safety maintenance. Colon cancer screen: cologuard ordered today. Mammogram: completed 10/2023.[facility]. > ordered by Dr. Edward Jolly at [facility] Cervical cancer screening: last pap: 2022, Dr. Edward Jolly Immunizations: tdap UTD 2021, Influenza UTD 2024 (encouraged yearly), zostavax -agreeable today Infectious disease screening: HIV and  Hep C  completed DeXA: routine screen  Return in about 24 weeks (around 05/09/2024) for Routine chronic condition follow-up.  Orders Placed This Encounter  Procedures   Zoster, Recombinant (Shingrix)   CBC   Comprehensive metabolic panel   Hemoglobin A1c   TSH   Lipid panel   Cologuard   Meds ordered this encounter  Medications   atorvastatin (LIPITOR) 10 MG tablet    Sig: Take 1  tablet (10 mg total) by mouth daily.    Dispense:  90 tablet    Refill:  4   verapamil (CALAN-SR) 180 MG CR tablet    Sig: Take 1 tablet (180 mg total) by mouth every morning.    Dispense:  90 tablet    Refill:  1    Referral Orders  No referral(s) requested today   Electronically signed by: Felix Pacini, DO [facility]

## 2023-11-26 ENCOUNTER — Encounter: Payer: Self-pay | Admitting: Family Medicine

## 2023-11-26 NOTE — Telephone Encounter (Signed)
 No further action needed.

## 2023-12-03 LAB — COLOGUARD: COLOGUARD: NEGATIVE

## 2024-02-05 NOTE — Progress Notes (Unsigned)
 58 y.o. [id number] Married Caucasian female here for annual exam.  Would like to discuss her bloating and gas that she is having. Having menopause symptoms- mood swings (anger and sadness), night sweats/hot flashes at night, not sleeping, & weight changes. Discuss possibly having blood work to [employer] menopause or medications to help symptoms.   Frustrated with her weight.  Has tried multiple options for weight loss and has not been successful.   Struggling with not sleeping well.   She wants to be happy again.   PCP will manage her HTN.   Patient and husband retired.  Her husband has sarcoidosis.  Son graduated from college.   PCP: Napolean Backbone A, DO   Patient's last menstrual period was 08/31/2021 (approximate).           Sexually active: No.  Not sexually active. The current method of family planning is POPs. Menopausal hormone therapy:  n/a Exercising: No.   Recently stopped working out and rowing.  Smoker:  no  OB History  Gravida Para Term Preterm AB Living  2 2 2   2   SAB IAB Ectopic Multiple Live Births          # Outcome Date GA Lbr Len/2nd Weight Sex Type Anes PTL Lv  2 Term           1 Term              HEALTH MAINTENANCE: Last 2 paps:  10/07/21 neg HR HPV neg, 06/15/17 neg HR HPV neg  History of abnormal Pap or positive HPV:  no Mammogram:   11/21/23 [facility]IRADS Cat 2 benign  Colonoscopy:  Cologuard neg in 2025.  Bone Density:  n/a  Result  n/a   Immunization History  Administered Date(s) Administered   Influenza Inj Mdck Quad Pf 06/24/2019   Influenza,inj,Quad PF,6+ Mos 06/15/2017, 07/11/2021, 06/25/2022   Influenza-Unspecified 07/24/2020, 05/28/2023   PFIZER(Purple Top)SARS-COV-2 Vaccination 10/23/2019, 11/14/2019, 06/22/2020   Rabies, IM 01/30/2019, 01/30/2019, 02/02/2019, 02/06/2019, 02/13/2019   Tdap 01/19/2020   Zoster Recombinant(Shingrix ) 11/23/2023      reports that she has never smoked. She has never used smokeless tobacco. She reports current  alcohol use. She reports that she does not use drugs.  Past Medical History:  Diagnosis Date   Acute pain of right knee 10/23/2019   Arthritis    ASCUS on Pap smear 2009   C&B 2009-BENIGN   Cervical radiculopathy 10/23/2019   c6-7 mild degenerative changes.    HTN (hypertension)    Hyperlipidemia    Migraine    Nephrolithiasis    Rabies exposure 02/02/2019    Past Surgical History:  Procedure Laterality Date   BASAL CELL CARCINOMA EXCISION     CESAREAN SECTION     CESAREAN SECTION  1999   CESAREAN SECTION  2004   COLPOSCOPY  2009   B9   KIDNEY SURGERY     REMOVAL OF KIDNEY STONES X 2   KNEE SURGERY  1988   RIGHT KNEE SURGERY    Current Outpatient Medications  Medication Sig Dispense Refill   atorvastatin  (LIPITOR) 10 MG tablet Take 1 tablet (10 mg total) by mouth daily. 90 tablet 4   norethindrone  (MICRONOR ) 0.35 MG tablet TAKE 1 TABLET BY MOUTH EVERY DAY 84 tablet 3   Probiotic Product (PROBIOTIC BLEND PO) Take by mouth.     verapamil  (CALAN -SR) 180 MG CR tablet Take 1 tablet (180 mg total) by mouth every morning. 90 tablet 1   VITAMIN D, CHOLECALCIFEROL, PO  Take 1,000 Units by mouth daily. Naturemade D3     No current facility-administered medications for this visit.    ALLERGIES: Patient has no known allergies.  Family History  Problem Relation Age of Onset   Hypertension Mother    Cancer Father        prostate   Hypertension Father    Heart attack Father    Hyperlipidemia Father    Kidney Stones Father    Stroke Maternal Grandmother    Heart attack Maternal Grandfather    Leukemia Paternal Grandfather     Review of Systems  All other systems reviewed and are negative.   PHYSICAL EXAM:  BP 130/84 (BP Location: Left Arm, Patient Position: Sitting)   Pulse 89   Ht 5' 7.25" (1.708 m)   Wt 164 lb (74.4 kg)   LMP 08/31/2021 (Approximate)   SpO2 97%   BMI 25.50 kg/m     General appearance: alert, cooperative and appears stated age Head:  normocephalic, without obvious abnormality, atraumatic Neck: no adenopathy, supple, symmetrical, trachea midline and thyroid  normal to inspection and palpation Lungs: clear to auscultation bilaterally Breasts: normal appearance, no masses or tenderness, No nipple retraction or dimpling, No nipple discharge or bleeding, No axillary adenopathy Heart: regular rate and rhythm Abdomen: soft, non-tender; no masses, no organomegaly Extremities: extremities normal, atraumatic, no cyanosis or edema Skin: skin color, texture, turgor normal. No rashes or lesions Lymph nodes: cervical, supraclavicular, and axillary nodes normal. Neurologic: grossly normal  Pelvic: External genitalia:  no lesions              No abnormal inguinal nodes palpated.              Urethra:  normal appearing urethra with no masses, tenderness or lesions              Bartholins and Skenes: normal                 Vagina: normal appearing vagina with normal color and discharge, no lesions              Cervix: no lesions              Pap taken: no Bimanual Exam:  Uterus:  normal size, contour, position, consistency, mobility, non-tender              Adnexa: no mass, fullness, tenderness              Rectal exam: yes.  Confirms.              Anus:  normal sphincter tone, no lesions  Chaperone was present for exam:  Cottie Diss, CMA  ASSESSMENT: Well woman visit with gynecologic exam. Menopausal symptoms.  Weight gain. HTN.  PHQ-9: 0  PLAN: Mammogram screening discussed. Self breast awareness reviewed. Pap and HRV collected:  no.  Due in 2028.  Guidelines for Calcium , Vitamin D, regular exercise program including cardiovascular and weight bearing exercise. Stop Micronor  for 2 week and will check FSH and estradiol  with lab visit.  We reviewed tx options for menopausal symptoms:  HRT, Effexor or Paxil, Gabapentin, Veozah. Risks and benefits reviewed.  Written information provided.  HRT may be the option that can address the  majority of her symptoms.   Discused WHI and use of HRT which can increase risk of PE, DVT, MI, stroke and breast cancer.  HRT can be used on conjunction with other medication such as Effexor or vaginal estrogen treatment if needed. Dietician  referral offered and declined.  We reviewed organized weight loss management programs through [facility], [facility], and [facility].  She currently declines referral for medical weight management and will let me know if she would like this in the future.  Follow up:  3 weeks to review lab results and choose treatment option. Annual exam in 1 year.

## 2024-02-06 ENCOUNTER — Encounter: Payer: Self-pay | Admitting: Obstetrics and Gynecology

## 2024-02-06 ENCOUNTER — Ambulatory Visit (INDEPENDENT_AMBULATORY_CARE_PROVIDER_SITE_OTHER): Payer: Managed Care, Other (non HMO) | Admitting: Obstetrics and Gynecology

## 2024-02-06 VITALS — BP 130/84 | HR 89 | Ht 67.25 in | Wt 164.0 lb

## 2024-02-06 DIAGNOSIS — Z01419 Encounter for gynecological examination (general) (routine) without abnormal findings: Secondary | ICD-10-CM

## 2024-02-06 DIAGNOSIS — N951 Menopausal and female climacteric states: Secondary | ICD-10-CM

## 2024-02-06 DIAGNOSIS — Z1331 Encounter for screening for depression: Secondary | ICD-10-CM

## 2024-02-06 NOTE — Patient Instructions (Signed)
 Gabapentin Capsules or Tablets What is this medication? GABAPENTIN (GA ba pen tin) treats nerve pain. It may also be used to prevent and control seizures in people with epilepsy. It works by calming overactive nerves in your body. This medicine may be used for other purposes; ask your health care provider or pharmacist if you have questions. COMMON BRAND NAME(S): Active-PAC with Gabapentin, Gabarone, Gralise, Neurontin What should I tell my care team before I take this medication? They need to know if you have any of these conditions: Kidney disease Lung or breathing disease Substance use disorder Suicidal thoughts, plans, or attempt by you or a family member An unusual or allergic reaction to gabapentin, other medications, foods, dyes, or preservatives Pregnant or trying to get pregnant Breastfeeding How should I use this medication? Take this medication by mouth with a glass of water. Follow the directions on the prescription label. You can take it with or without food. If it upsets your stomach, take it with food. Take your medication at regular intervals. Do not take it more often than directed. Do not stop taking except on your care team's advice. If you are directed to break the 600 or 800 mg tablets in half as part of your dose, the extra half tablet should be used for the next dose. If you have not used the extra half tablet within 28 days, it should be thrown away. A special MedGuide will be given to you by the pharmacist with each prescription and refill. Be sure to read this information carefully each time. Talk to your care team about the use of this medication in children. While this medication may be prescribed for children as young as 3 years for selected conditions, precautions do apply. Overdosage: If you think you have taken too much of this medicine contact a poison control center or emergency room at once. NOTE: This medicine is only for you. Do not share this medicine with  others. What if I miss a dose? If you miss a dose, take it as soon as you can. If it is almost time for your next dose, take only that dose. Do not take double or extra doses. What may interact with this medication? Alcohol Antihistamines for allergy, cough, and cold Certain medications for anxiety or sleep Certain medications for depression like amitriptyline, fluoxetine, sertraline Certain medications for seizures like phenobarbital, primidone Certain medications for stomach problems General anesthetics like halothane, isoflurane, methoxyflurane, propofol Local anesthetics like lidocaine, pramoxine, tetracaine Medications that relax muscles for surgery Opioid medications for pain Phenothiazines like chlorpromazine, mesoridazine, prochlorperazine, thioridazine This list may not describe all possible interactions. Give your health care provider a list of all the medicines, herbs, non-prescription drugs, or dietary supplements you use. Also tell them if you smoke, drink alcohol, or use illegal drugs. Some items may interact with your medicine. What should I watch for while using this medication? Visit your care team for regular checks on your progress. You may want to keep a record at home of how you feel your condition is responding to treatment. You may want to share this information with your care team at each visit. You should contact your care team if your seizures get worse or if you have any new types of seizures. Do not stop taking this medication or any of your seizure medications unless instructed by your care team. Stopping your medication suddenly can increase your seizures or their severity. This medication may cause serious skin reactions. They can happen weeks to  months after starting the medication. Contact your care team right away if you notice fevers or flu-like symptoms with a rash. The rash may be red or purple and then turn into blisters or peeling of the skin. Or, you might  notice a red rash with swelling of the face, lips or lymph nodes in your neck or under your arms. Wear a medical identification bracelet or chain if you are taking this medication for seizures. Carry a card that lists all your medications. This medication may affect your coordination, reaction time, or judgment. Do not drive or operate machinery until you know how this medication affects you. Sit up or stand slowly to reduce the risk of dizzy or fainting spells. Drinking alcohol with this medication can increase the risk of these side effects. Your mouth may get dry. Chewing sugarless gum or sucking hard candy, and drinking plenty of water may help. Watch for new or worsening thoughts of suicide or depression. This includes sudden changes in mood, behaviors, or thoughts. These changes can happen at any time but are more common in the beginning of treatment or after a change in dose. Call your care team right away if you experience these thoughts or worsening depression. If you become pregnant while using this medication, you may enroll in the [place] [employer] Antiepileptic Drug Pregnancy Registry by calling [phone]. This registry collects information about the safety of antiepileptic medication use during pregnancy. What side effects may I notice from receiving this medication? Side effects that you should report to your care team as soon as possible: Allergic reactions or angioedema--skin rash, itching, hives, swelling of the face, eyes, lips, tongue, arms, or legs, trouble swallowing or breathing Rash, fever, and swollen lymph nodes Thoughts of suicide or self harm, worsening mood, feelings of depression Trouble breathing Unusual changes in mood or behavior in children after use such as difficulty concentrating, hostility, or restlessness Side effects that usually do not require medical attention (report to your care team if they continue or are  bothersome): Dizziness Drowsiness Nausea Swelling of ankles, feet, or hands Vomiting This list may not describe all possible side effects. Call your doctor for medical advice about side effects. You may report side effects to FDA at 1-800-FDA-1088. Where should I keep my medication? Keep out of reach of children and pets. Store at room temperature between 15 and 30 degrees C (59 and 86 degrees F). Get rid of any unused medication after the expiration date. This medication may cause accidental overdose and death if taken by other adults, children, or pets. To get rid of medications that are no longer needed or have expired: Take the medication to a medication take-back program. Check with your pharmacy or law enforcement to find a location. If you cannot return the medication, check the label or package insert to see if the medication should be thrown out in the garbage or flushed down the toilet. If you are not sure, ask your care team. If it is safe to put it in the trash, empty the medication out of the container. Mix the medication with cat litter, dirt, coffee grounds, or other unwanted substance. Seal the mixture in a bag or container. Put it in the trash. NOTE: This sheet is a summary. It may not cover all possible information. If you have questions about this medicine, talk to your doctor, pharmacist, or health care provider.  2024 Elsevier/Gold Standard (2022-06-27 00:00:00)  Fezolinetant Tablets What is this medication? FEZOLINETANT (FEZ oh LIN e tant)  reduces the number and severity of hot flashes due to menopause. It works by blocking substances in your body that cause hot flashes and night sweats. This medicine may be used for other purposes; ask your health care provider or pharmacist if you have questions. COMMON BRAND NAME(S): VEOZAH What should I tell my care team before I take this medication? They need to know if you have any of these conditions: Kidney disease Liver  disease An unusual or allergic reaction to fezolinetant, other medications, foods, dyes, or preservatives Pregnant or trying to get pregnant Breastfeeding How should I use this medication? Take this medication by mouth with water. Take it as directed on the prescription label at the same time every day. Do not cut, crush, or chew this medication. Swallow the tablets whole. You can take it with or without food. If it upsets your stomach, take it with food. Keep taking it unless your care team tells you to stop. Talk to your care team about the use of this medication in children. Special care may be needed. Overdosage: If you think you have taken too much of this medicine contact a poison control center or emergency room at once. NOTE: This medicine is only for you. Do not share this medicine with others. What if I miss a dose? If you miss a dose, take it as soon as you can unless it is more than 12 hours late. If it is more than 12 hours late, skip the missed dose. Take the next dose at the normal time. What may interact with this medication? Other medications may affect the way this medication works. Talk with your care team about all of the medications you take. They may suggest changes to your treatment plan to lower the risk of side effects and to make sure your medications work as intended. This list may not describe all possible interactions. Give your health care provider a list of all the medicines, herbs, non-prescription drugs, or dietary supplements you use. Also tell them if you smoke, drink alcohol, or use illegal drugs. Some items may interact with your medicine. What should I watch for while using this medication? Visit your care team for regular checks on your progress. Tell your care team if your symptoms do not start to get better or if they get worse. You may need blood work while taking this medication. What side effects may I notice from receiving this medication? Side effects  that you should report to your care team as soon as possible: Allergic reactions--skin rash, itching, hives, swelling of the face, lips, tongue, or throat Liver injury--right upper belly pain, loss of appetite, nausea, light-colored stool, dark yellow or brown urine, yellowing skin or eyes, unusual weakness or fatigue Side effects that usually do not require medical attention (report these to your care team if they continue or are bothersome): Back pain Diarrhea Hot flashes Stomach pain Trouble sleeping This list may not describe all possible side effects. Call your doctor for medical advice about side effects. You may report side effects to FDA at 1-800-FDA-1088. Where should I keep my medication? Keep out of the reach of children and pets. Store at room temperature between 20 and 25 degrees C (68 and 77 degrees F). Get rid of any unused medication after the expiration date. To get rid of medications that are no longer needed or have expired: Take the medication to a medication take-back program. Check with your pharmacy or law enforcement to find a location. If you  cannot return the medication, check the label or package insert to see if the medication should be thrown out in the garbage or flushed down the toilet. If you are not sure, ask your care team. If it is safe to put it in the trash, take the medication out of the container. Mix the medication with cat litter, dirt, coffee grounds, or other unwanted substance. Seal the mixture in a bag or container. Put it in the trash. NOTE: This sheet is a summary. It may not cover all possible information. If you have questions about this medicine, talk to your doctor, pharmacist, or health care provider.  2024 Elsevier/Gold Standard (2022-02-16 00:00:00)  Venlafaxine Extended-Release Capsules What is this medication? VENLAFAXINE (VEN la fax een) treats depression and anxiety. It increases the amount of serotonin and norepinephrine in the brain,  hormones that help regulate mood. It belongs to a group of medications called SNRIs. This medicine may be used for other purposes; ask your health care provider or pharmacist if you have questions. COMMON BRAND NAME(S): Effexor XR What should I tell my care team before I take this medication? They need to know if you have any of these conditions: Bleeding disorders Glaucoma Heart disease High blood pressure High cholesterol Kidney disease Liver disease Low levels of sodium in the blood Mania or bipolar disorder Seizures Suicidal thoughts, plans, or attempt by you or a family member Take medications that treat or prevent blood clots Thyroid  disease An unusual or allergic reaction to venlafaxine, other medications, foods, dyes, or preservatives Pregnant or trying to get pregnant Breastfeeding How should I use this medication? Take this medication by mouth with a full glass of water. Take it as directed on the prescription label. Do not cut, crush, or chew this medication. Take it with food. You may open the capsule and put the contents in 1 teaspoon of applesauce. Swallow the medication and applesauce right away. Do not chew the medication or applesauce. Follow with a glass of water to ensure complete swallowing of the pellets. Try to take your medication at about the same time each day. Do not take your medication more often than directed. Keep taking this medication unless your care team tells you to stop. Stopping it too quickly can cause serious side effects. It can also make your condition worse. A special MedGuide will be given to you by the pharmacist with each prescription and refill. Be sure to read this information carefully each time. Talk to your care team about the use of this medication in children. Special care may be needed. Overdosage: If you think you have taken too much of this medicine contact a poison control center or emergency room at once. NOTE: This medicine is only  for you. Do not share this medicine with others. What if I miss a dose? If you miss a dose, take it as soon as you can. If it is almost time for your next dose, take only that dose. Do not take double or extra doses. What may interact with this medication? Do not take this medication with any of the following: Alcohol Certain medications for fungal infections, such as fluconazole, itraconazole, ketoconazole, posaconazole, voriconazole Cisapride Desvenlafaxine Dronedarone Duloxetine Levomilnacipran Linezolid MAOIs, such as Carbex, Eldepryl, Marplan, Nardil, and Parnate Methylene blue (injected into a vein) Milnacipran Pimozide Thioridazine This medication may also interact with the following: Amphetamines Aspirin and aspirin-like medications Certain medications for mental health conditions Certain medications for migraine headaches, such as almotriptan, eletriptan, frovatriptan, naratriptan, rizatriptan,  sumatriptan, zolmitriptan Certain medications for sleep Certain medications that treat or prevent blood clots, such as dalteparin, enoxaparin, warfarin Cimetidine Clozapine Diuretics Fentanyl Furazolidone Indinavir Isoniazid Lithium Metoprolol NSAIDS, medications for pain and inflammation, such as ibuprofen or naproxen Other medications that cause heart rhythm changes Procarbazine Rasagiline Supplements, such as St. John's wort, kava kava, valerian Tramadol Tryptophan This list may not describe all possible interactions. Give your health care provider a list of all the medicines, herbs, non-prescription drugs, or dietary supplements you use. Also tell them if you smoke, drink alcohol, or use illegal drugs. Some items may interact with your medicine. What should I watch for while using this medication? Tell your care team if your symptoms do not get better or if they get worse. Visit your care team for regular checks on your progress. Because it may take several weeks to see  the full effects of this medication, it is important to continue your treatment as prescribed by your care team. Watch for new or worsening thoughts of suicide or depression. This includes sudden changes in mood, behaviors, or thoughts. These changes can happen at any time but are more common in the beginning of treatment or after a change in dose. Call your care team right away if you experience these thoughts or worsening depression. This medication may cause mood and behavior changes, such as anxiety, nervousness, irritability, hostility, restlessness, excitability, hyperactivity, or trouble sleeping. These changes can happen at any time but are more common in the beginning of treatment or after a change in dose. Call your care team right away if you notice any of these symptoms. This medication can cause an increase in blood pressure. Check with your care team for instructions on monitoring your blood pressure while taking this medication. This medication may affect your coordination, reaction time, or judgment. Do not drive or operate machinery until you know how this medication affects you. Sit up or stand slowly to reduce the risk of dizzy or fainting spells. Drinking alcohol with this medication can increase the risk of these side effects. Your mouth may get dry. Chewing sugarless gum or sucking hard candy and drinking plenty of water may help. Contact your care team if the problem does not go away or is severe. What side effects may I notice from receiving this medication? Side effects that you should report to your care team as soon as possible: Allergic reactions--skin rash, itching, hives, swelling of the face, lips, tongue, or throat Bleeding--bloody or black, tar-like stools, red or dark brown urine, vomiting blood or brown material that looks like coffee grounds, small, red or purple spots on skin, unusual bleeding or bruising Heart rhythm changes--fast or irregular heartbeat, dizziness,  feeling faint or lightheaded, chest pain, trouble breathing Increase in blood pressure Loss of appetite with weight loss Low sodium level--muscle weakness, fatigue, dizziness, headache, confusion Serotonin syndrome--irritability, confusion, fast or irregular heartbeat, muscle stiffness, twitching muscles, sweating, high fever, seizures, chills, vomiting, diarrhea Sudden eye pain or change in vision such as blurry vision, seeing halos around lights, vision loss Thoughts of suicide or self-harm, worsening mood, feelings of depression Side effects that usually do not require medical attention (report to your care team if they continue or are bothersome): Anxiety, nervousness Change in sex drive or performance Dizziness Dry mouth Excessive sweating Nausea Tremors or shaking Trouble sleeping This list may not describe all possible side effects. Call your doctor for medical advice about side effects. You may report side effects to FDA at 1-800-FDA-1088.  Where should I keep my medication? Keep out of the reach of children and pets. Store at a controlled temperature between 20 and 25 degrees C (68 degrees and 77 degrees F), in a dry place. Throw away any unused medication after the expiration date. NOTE: This sheet is a summary. It may not cover all possible information. If you have questions about this medicine, talk to your doctor, pharmacist, or health care provider.  2024 Elsevier/Gold Standard (2022-09-07 00:00:00)  Menopause and HRT (Hormone Replacement Therapy): What to Expect Menopause is the time in your life when your menstrual period stops, and your ovaries stop making certain hormones. These hormones are called estrogen and progesterone. Low levels of these hormones can affect your health and cause symptoms. Hormone replacement therapy (HRT) is a treatment that replaces the estrogen and progesterone in your body. Types of HRT  There are many types and doses of HRT. Talk with your  health care provider about what form of HRT is best for you. HRT may include: Estrogen and progestin. This is more common if you have a uterus. Estrogen-only therapy. This is used if you don't have a uterus. You may be given the HRT as: Pills. Patches. Gels. Sprays. A vaginal cream. Vaginal rings. Vaginal inserts. How many hormones you need to take and how long you should take them depend on your health. You should: Start HRT at the lowest possible dose. Stop HRT as soon as you're told to. Work with your provider so you feel informed and comfortable with what happens. Tell a health care provider about: Any allergies you have. Whether you've had blood clots or are at risk for blood clots. Whether you or family members have had cancer, such as cancer of: The breasts. The ovaries. The uterus. Any surgeries you've had. Any medical problems you have. All medicines you take. These include vitamins, herbs, eye drops, and creams. Whether you're pregnant or may be pregnant. What are the benefits? HRT can help with the symptoms of menopause. The benefits depend on: What symptoms you have. How bad your symptoms are. Your overall health. Symptoms of menopause that HRT can help with include: Hot flashes and night sweats. Hot flashes are sudden feelings of heat that spread over your face and body. Your skin may turn red, like a blush. Night sweats are hot flashes that happen when you're sleeping or trying to sleep. Bone loss. The body loses calcium  faster after menopause. This can cause your bones to be weaker. They may be more likely to break. Vaginal dryness. The lining of the vagina can get thin and dry, which can cause: Pain during sex. Infection. Burning. Itching. Urinary tract infections. Not being able to control when you pee. Irritability, which means getting annoyed easily. Short-term memory problems. What are the risks? Risks depend on your health and medical history. They  also depend on if you get estrogen and progestin or estrogen-only therapy. HRT may make you more likely to have: Spotting. This is when a small amount of blood leaks from your vagina when you don't expect it to. Endometrial cancer. This is cancer of the lining of the uterus. Breast cancer. Higher density of breast tissue. This can make it harder to find breast cancer on an X-ray. A stroke. Heart disease. Blood clots. Gallbladder or liver disease. You may be more at risk for problems if you have: Endometrial cancer. Liver disease. Heart disease. Breast cancer. A history of blood clots. A history of stroke. Follow these instructions at  home: Take your medicines only as told. Do not smoke, vape, or use nicotine or tobacco. Go to your provider as often as told to get: Mammograms. Pelvic exams. Medical checkups. Contact a health care provider if: Your legs hurt or swell. You feel lumps or changes in your breasts or armpits. You feel pain, burning, or bleeding when you pee. You have bleeding from your vagina that's not normal. You feel dizzy or get headaches. You have pain in your belly. Get help right away if: You have shortness of breath. You have chest pain. You have slurred speech. Any part of your arms or legs feels weak or numb. These symptoms may be an emergency. Call 911 right away. Do not wait to see if the symptoms will go away. Do not drive yourself to the hospital. This information is not intended to replace advice given to you by your health care provider. Make sure you discuss any questions you have with your health care provider. Document Revised: 05/11/2023 Document Reviewed: 05/11/2023 Elsevier Patient Education  2024 [employer].   EXERCISE AND DIET:  We recommended that you start or continue a regular exercise program for good health. Regular exercise means any activity that makes your heart beat faster and makes you sweat.  We recommend exercising at least  30 minutes per day at least 3 days a week, preferably 4 or 5.  We also recommend a diet low in fat and sugar.  Inactivity, poor dietary choices and obesity can cause diabetes, heart attack, stroke, and kidney damage, among others.    ALCOHOL AND SMOKING:  Women should limit their alcohol intake to no more than 7 drinks/beers/glasses of wine (combined, not each!) per week. Moderation of alcohol intake to this level decreases your risk of breast cancer and liver damage. And of course, no recreational drugs are part of a healthy lifestyle.  And absolutely no smoking or even second hand smoke. Most people know smoking can cause heart and lung diseases, but did you know it also contributes to weakening of your bones? Aging of your skin?  Yellowing of your teeth and nails?  CALCIUM  AND VITAMIN D:  Adequate intake of calcium  and Vitamin D are recommended.  The recommendations for exact amounts of these supplements seem to change often, but generally speaking 600 mg of calcium  (either carbonate or citrate) and 800 units of Vitamin D per day seems prudent. Certain women may benefit from higher intake of Vitamin D.  If you are among these women, your doctor will have told you during your visit.    PAP SMEARS:  Pap smears, to check for cervical cancer or precancers,  have traditionally been done yearly, although recent scientific advances have shown that most women can have pap smears less often.  However, every woman still should have a physical exam from her gynecologist every year. It will include a breast check, inspection of the vulva and vagina to check for abnormal growths or skin changes, a visual exam of the cervix, and then an exam to evaluate the size and shape of the uterus and ovaries.  And after 56 years of age, a rectal exam is indicated to check for rectal cancers. We will also provide age appropriate advice regarding health maintenance, like when you should have certain vaccines, screening for sexually  transmitted diseases, bone density testing, colonoscopy, mammograms, etc.   MAMMOGRAMS:  All women over 39 years old should have a yearly mammogram. Many facilities now offer a "3D" mammogram, which may  cost around $50 extra out of pocket. If possible,  we recommend you accept the option to have the 3D mammogram performed.  It both reduces the number of women who will be called back for extra views which then turn out to be normal, and it is better than the routine mammogram at detecting truly abnormal areas.    COLONOSCOPY:  Colonoscopy to screen for colon cancer is recommended for all women at age 58.  We know, you hate the idea of the prep.  We agree, BUT, having colon cancer and not knowing it is worse!!  Colon cancer so often starts as a polyp that can be seen and removed at colonscopy, which can quite literally save your life!  And if your first colonoscopy is normal and you have no family history of colon cancer, most women don't have to have it again for 10 years.  Once every ten years, you can do something that may end up saving your life, right?  We will be happy to help you get it scheduled when you are ready.  Be sure to check your insurance coverage so you understand how much it will cost.  It may be covered as a preventative service at no cost, but you should check your particular policy.

## 2024-02-21 ENCOUNTER — Other Ambulatory Visit

## 2024-02-21 DIAGNOSIS — N951 Menopausal and female climacteric states: Secondary | ICD-10-CM

## 2024-02-21 LAB — FOLLICLE STIMULATING HORMONE: FSH: 72.7 m[IU]/mL

## 2024-02-21 LAB — ESTRADIOL: Estradiol: <15 pg/mL

## 2024-02-22 ENCOUNTER — Ambulatory Visit: Payer: Self-pay | Admitting: Obstetrics and Gynecology

## 2024-03-04 ENCOUNTER — Encounter: Payer: Self-pay | Admitting: Obstetrics and Gynecology

## 2024-03-04 ENCOUNTER — Ambulatory Visit: Admitting: Obstetrics and Gynecology

## 2024-03-04 VITALS — BP 132/84 | HR 94

## 2024-03-04 DIAGNOSIS — R140 Abdominal distension (gaseous): Secondary | ICD-10-CM | POA: Diagnosis not present

## 2024-03-04 DIAGNOSIS — N951 Menopausal and female climacteric states: Secondary | ICD-10-CM | POA: Diagnosis not present

## 2024-03-04 MED ORDER — ESTRADIOL 0.05 MG/24HR TD PTTW: 1.0000 | MEDICATED_PATCH | TRANSDERMAL | 1 refills | Status: DC

## 2024-03-04 MED ORDER — PROGESTERONE MICRONIZED 100 MG PO CAPS: 100.0000 mg | ORAL_CAPSULE | Freq: Every day | ORAL | 1 refills | Status: DC

## 2024-03-04 NOTE — Patient Instructions (Signed)
 Menopause and HRT (Hormone Replacement Therapy): What to Expect Menopause is the time in your life when your menstrual period stops, and your ovaries stop making certain hormones. These hormones are called estrogen and progesterone. Low levels of these hormones can affect your health and cause symptoms. Hormone replacement therapy (HRT) is a treatment that replaces the estrogen and progesterone in your body. Types of HRT  There are many types and doses of HRT. Talk with your health care provider about what form of HRT is best for you. HRT may include: Estrogen and progestin. This is more common if you have a uterus. Estrogen-only therapy. This is used if you don't have a uterus. You may be given the HRT as: Pills. Patches. Gels. Sprays. A vaginal cream. Vaginal rings. Vaginal inserts. How many hormones you need to take and how long you should take them depend on your health. You should: Start HRT at the lowest possible dose. Stop HRT as soon as you're told to. Work with your provider so you feel informed and comfortable with what happens. Tell a health care provider about: Any allergies you have. Whether you've had blood clots or are at risk for blood clots. Whether you or family members have had cancer, such as cancer of: The breasts. The ovaries. The uterus. Any surgeries you've had. Any medical problems you have. All medicines you take. These include vitamins, herbs, eye drops, and creams. Whether you're pregnant or may be pregnant. What are the benefits? HRT can help with the symptoms of menopause. The benefits depend on: What symptoms you have. How bad your symptoms are. Your overall health. Symptoms of menopause that HRT can help with include: Hot flashes and night sweats. Hot flashes are sudden feelings of heat that spread over your face and body. Your skin may turn red, like a blush. Night sweats are hot flashes that happen when you're sleeping or trying to  sleep. Bone loss. The body loses calcium faster after menopause. This can cause your bones to be weaker. They may be more likely to break. Vaginal dryness. The lining of the vagina can get thin and dry, which can cause: Pain during sex. Infection. Burning. Itching. Urinary tract infections. Not being able to control when you pee. Irritability, which means getting annoyed easily. Short-term memory problems. What are the risks? Risks depend on your health and medical history. They also depend on if you get estrogen and progestin or estrogen-only therapy. HRT may make you more likely to have: Spotting. This is when a small amount of blood leaks from your vagina when you don't expect it to. Endometrial cancer. This is cancer of the lining of the uterus. Breast cancer. Higher density of breast tissue. This can make it harder to find breast cancer on an X-ray. A stroke. Heart disease. Blood clots. Gallbladder or liver disease. You may be more at risk for problems if you have: Endometrial cancer. Liver disease. Heart disease. Breast cancer. A history of blood clots. A history of stroke. Follow these instructions at home: Take your medicines only as told. Do not smoke, vape, or use nicotine or tobacco. Go to your provider as often as told to get: Mammograms. Pelvic exams. Medical checkups. Contact a health care provider if: Your legs hurt or swell. You feel lumps or changes in your breasts or armpits. You feel pain, burning, or bleeding when you pee. You have bleeding from your vagina that's not normal. You feel dizzy or get headaches. You have pain in your belly.  Get help right away if: You have shortness of breath. You have chest pain. You have slurred speech. Any part of your arms or legs feels weak or numb. These symptoms may be an emergency. Call 911 right away. Do not wait to see if the symptoms will go away. Do not drive yourself to the hospital. This information is  not intended to replace advice given to you by your health care provider. Make sure you discuss any questions you have with your health care provider. Document Revised: 05/11/2023 Document Reviewed: 05/11/2023 Elsevier Patient Education  2024 [employer].

## 2024-03-04 NOTE — Progress Notes (Signed)
 GYNECOLOGY  VISIT   HPI: 58 y.o.   Married  Caucasian female   G2P2002 with Patient's last menstrual period was 08/31/2021 (approximate).   here for: 3 week follow up to discuss lab work and choose treatment options      [facility] 7/27 and estradiol  <15 on 02/21/24.  Does not want to treat symptoms with antidepressant.  Does not want to have a menstrual cycle, which would be not welcome.   Has a lot of bloating, and this is very concerning to her.  Symptoms worsen during the day.  Has a good bowel movement daily.  She has tried a lot of remedies.    She wants to treat hot flashes.  Had night sweats last year.  Does sleep with a fan.   Since stopping the Micronor , she has had facial flushing.    GYNECOLOGIC HISTORY: Patient's last menstrual period was 08/31/2021 (approximate). Contraception:  micronor .  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    Elevated hemoglobin A1c 11/23/2023   6.0   HTN (hypertension)    Hyperlipidemia    Migraine    Nephrolithiasis    Rabies exposure 02/02/2019    Past Surgical History:  Procedure Laterality Date   BASAL CELL CARCINOMA EXCISION     CESAREAN SECTION     CESAREAN SECTION  1999   CESAREAN  SECTION  2004   COLPOSCOPY  2009   B9   KIDNEY SURGERY     REMOVAL OF KIDNEY STONES X 2   KNEE SURGERY  1988   RIGHT KNEE SURGERY    Current Outpatient Medications  Medication Sig Dispense Refill   atorvastatin  (LIPITOR) 10 MG tablet Take 1 tablet (10 mg total) by mouth daily. 90 tablet 4   Ginger, Zingiber officinalis, (GINGER PO) Take by mouth.     norethindrone  (MICRONOR ) 0.35 MG tablet TAKE 1 TABLET BY MOUTH EVERY DAY 84 tablet 3   Probiotic Product (PROBIOTIC BLEND PO) Take by mouth.     verapamil  (CALAN -SR) 180 MG CR tablet Take 1 tablet (180 mg total) by mouth every morning. 90 tablet 1   VITAMIN D, CHOLECALCIFEROL, PO Take 1,000 Units by mouth daily. Naturemade D3     No current facility-administered medications for this visit.     ALLERGIES: Patient has no known allergies.  Family History  Problem Relation Age of Onset  Hypertension Mother    Cancer Father        prostate   Hypertension Father    Heart attack Father    Hyperlipidemia Father    Kidney Stones Father    Stroke Maternal Grandmother    Heart attack Maternal Grandfather    Leukemia Paternal Grandfather     Social History   Socioeconomic History   Marital status: Married    Spouse name: Not on file   Number of children: 2   Years of education: Not on file   Highest education level: Not on file  Occupational History   Not on file  Tobacco Use   Smoking status: Never   Smokeless tobacco: Never  Vaping Use   Vaping status: Never Used  Substance and Sexual Activity   Alcohol use: Yes    Comment: Occasional   Drug use: No   Sexual activity: Not Currently    Partners: Male    Birth control/protection: Pill  Other Topics Concern   Not on file  Social History Narrative   Marital status/children/pets: Married.  2 children.   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   Safety:      -smoke alarm in the home:Yes     - wears seatbelt: Yes     - Feels safe  in their relationships: Yes   Social Drivers of Corporate investment banker Strain: Low Risk  (02/25/2023)   Received from [facility]   Overall Financial Resource Strain (CARDIA)    Difficulty of Paying Living Expenses: Not hard at all  Food Insecurity: No Food Insecurity (02/25/2023)   Received from [facility]   Hunger Vital Sign    Worried About Running Out of Food in the Last Year: Never true    Ran Out of Food in the Last Year: Never true  Transportation Needs: No Transportation Needs (02/25/2023)   Received from [facility] - Transportation    Lack of Transportation (Medical): No    Lack of Transportation (Non-Medical): No  Physical Activity: Unknown (02/25/2023)   Received from [facility]   Exercise Vital Sign    Days of Exercise per Week: Patient declined    Minutes of Exercise per Session: Not on file  Stress: No Stress Concern Present (02/25/2023)   Received from [facility] - Occupational Stress Questionnaire    Feeling of Stress : Not at all  Social Connections: Unknown (02/03/2022)   Received from [facility]   Social Network    Social Network: Not on file  Intimate Partner Violence: Not At Risk (02/25/2023)   Received from [facility]   HITS    Over the last 12 months how often did your partner physically hurt you?: Never    Over the last 12 months how often did your partner insult you or talk down to you?: Rarely    Over the last 12 months how often did your partner threaten you with physical harm?: Never    Over the last 12 months how often did your partner scream or curse at you?: Never    Review of Systems  All other systems reviewed and are negative.   PHYSICAL EXAMINATION:   BP 132/84 (BP Location: Left Arm, Patient Position: Sitting)   Pulse 94   LMP 08/31/2021 (Approximate)   SpO2 97%     General appearance: alert, cooperative and appears stated age  ASSESSMENT:  Menopausal symptoms.  Abdominal bloating.   PLAN:  We discussed options for tx.  Rx for Vivelle  Dot 0.05 mg twice weekly #24, RF 1 and Prometrium 100 mg q hs, #90, RF 1. Discused WHI and use of HRT which can increase risk of PE, DVT, MI, stroke and breast cancer.  Benefits of treating vasomotor symptoms, reducing risk of osteoporosis, and  Cardiovascular benefit reviewed. FU in 3 months, sooner if needed.  She will watch out for potential bloating with the Prometrium use.  Referral to gastroenterology.   31 min  total time was spent for this patient encounter, including preparation, face-to-face counseling with the patient, coordination of care, and documentation of the encounter.

## 2024-03-25 NOTE — Progress Notes (Unsigned)
 Anna Console, PA-C [address] [place], [place]  [address] Phone: [phone]   Gastroenterology Consultation  Referring Provider:     Cathlyn JAYSON Nikki Bobie* Primary Care Physician:  Catherine Charlies LABOR, DO Primary Gastroenterologist:  Anna Console, PA-C / Elspeth Naval, MD  Reason for Consultation:     Abdominal bloating        HPI:   Anna Mccullough is a 58 y.o. y/o female referred for consultation & management  by Catherine Charlies A, DO.    New patient.  Here to evaluate lower abdominal bloating, gas, and flatulence.  Has tried multiple OTC probiotics with little benefit.  Patient states she has had lower intestinal gas, flatulence, lower abdominal bloating, and constipation which started 1.5 years ago.  She thinks all of her GI symptoms are due to menopause.  She was started on hormone replacement therapy by her GYN, which has not helped.  Of note she started verapamil  2 years ago for hypertension.  She had no previous history of GI symptoms before 1.5 years ago.  She drinks coffee, takes ginger and turmeric which helps her have a bowel movement in the morning.  Bloating is better in the morning and worse in the evening and at night.  She is very frustrated about a 10 pound weight gain in the past year.  She denies belching, abdominal pain, heartburn or upper GI symptoms.  She denies rectal bleeding, lower abdominal pain or weight loss.  No family history of colon cancer.  No previous GI evaluation or colonoscopy.  11/27/2023: Negative screening Cologuard test.    11/23/2023: Normal CBC, CMP, TSH.  No recent abdominal imaging.  Past Medical History:  Diagnosis Date   Acute pain of right knee 10/23/2019   Arthritis    ASCUS on Pap smear 2009   C&B 2009-BENIGN   Cervical radiculopathy 10/23/2019   c6-7 mild degenerative changes.    Elevated hemoglobin A1c 11/23/2023   6.0   HTN (hypertension)    Hyperlipidemia    Migraine    Nephrolithiasis    Rabies exposure  02/02/2019    Past Surgical History:  Procedure Laterality Date   BASAL CELL CARCINOMA EXCISION     CESAREAN SECTION     CESAREAN SECTION  1999   CESAREAN SECTION  2004   COLPOSCOPY  2009   B9   KIDNEY SURGERY     REMOVAL OF KIDNEY STONES X 2   KNEE SURGERY  1988   RIGHT KNEE SURGERY    Prior to Admission medications   Medication Sig Start Date End Date Taking? Authorizing Provider  atorvastatin  (LIPITOR) 10 MG tablet Take 1 tablet (10 mg total) by mouth daily. 11/23/23   Kuneff, Renee A, DO  estradiol  (VIVELLE -DOT) 0.05 MG/24HR patch Place 1 patch (0.05 mg total) onto the skin 2 (two) times a week. 03/06/24   Amundson C Silva, Brook E, MD  Ginger, Zingiber officinalis, (GINGER PO) Take by mouth.    [provider]  Probiotic Product (PROBIOTIC BLEND PO) Take by mouth.    [provider]  progesterone  (PROMETRIUM ) 100 MG capsule Take 1 capsule (100 mg total) by mouth daily. Take at bedtime. 03/04/24   Cathlyn JAYSON Nikki Bobie FORBES, MD  verapamil  (CALAN -SR) 180 MG CR tablet Take 1 tablet (180 mg total) by mouth every morning. 11/23/23   Kuneff, Renee A, DO  VITAMIN D, CHOLECALCIFEROL, PO Take 1,000 Units by mouth daily. Naturemade D3    [provider]    Family History  Problem Relation Age of Onset   Hypertension Mother    Cancer Father        prostate   Hypertension Father    Heart attack Father    Hyperlipidemia Father    Kidney Stones Father    Stroke Maternal Grandmother    Heart attack Maternal Grandfather    Leukemia Paternal Grandfather    Rheum arthritis Neg Hx    Colon cancer Neg Hx    Esophageal cancer Neg Hx      Social History   Tobacco Use   Smoking status: Never   Smokeless tobacco: Never  Vaping Use   Vaping status: Never Used  Substance Use Topics   Alcohol use: Yes    Comment: Occasional   Drug use: No    Allergies as of 03/26/2024   (No Known Allergies)    Review of Systems:    All systems reviewed and negative  except where noted in HPI.   Physical Exam:  BP 116/70   Pulse 82   Ht 5' 6 (1.676 m)   Wt 166 lb (75.3 kg)   LMP 08/31/2021 (Approximate)   BMI 26.79 kg/m  Patient's last menstrual period was 08/31/2021 (approximate).  General:   Alert,  Well-developed, well-nourished, pleasant and cooperative in NAD Lungs:  Respirations even and unlabored.     Neurologic:  Alert and oriented x3;  grossly normal neurologically. Psych:  Alert and cooperative. Agitated, depressed mood and affect.  Imaging Studies: No results found.  Labs: CBC    Component Value Date/Time   WBC 7.6 11/23/2023 0810   RBC 4.75 11/23/2023 0810   HGB 14.3 11/23/2023 0810   HCT 43.4 11/23/2023 0810   PLT 254.0 11/23/2023 0810   MCV 91.3 11/23/2023 0810   MCH 30.5 08/29/2019 0926   MCHC 33.1 11/23/2023 0810   RDW 13.4 11/23/2023 0810   LYMPHSABS 2.8 11/21/2022 0908   MONOABS 0.6 11/21/2022 0908   EOSABS 0.1 11/21/2022 0908   BASOSABS 0.0 11/21/2022 0908    CMP     Component Value Date/Time   NA 141 11/23/2023 0810   K 4.3 11/23/2023 0810   CL 104 11/23/2023 0810   CO2 30 11/23/2023 0810   GLUCOSE 93 11/23/2023 0810   BUN 20 11/23/2023 0810   CREATININE 1.03 11/23/2023 0810   CREATININE 0.97 08/29/2019 0926   CALCIUM  9.5 11/23/2023 0810   PROT 6.6 11/23/2023 0810   ALBUMIN 4.3 11/23/2023 0810   AST 20 11/23/2023 0810   ALT 19 11/23/2023 0810   ALKPHOS 88 11/23/2023 0810   BILITOT 0.5 11/23/2023 0810    Assessment and Plan:   Anna Mccullough is a 58 y.o. y/o female has been referred for gas, bloating, mild constipation, and flatulence x 1.5 years.  Possibly due to menopause versus adverse side effect of verapamil  medication.  She had a negative Cologuard test.  Normal CBC, CMP, TSH labs.  No anemia.  No weight loss, rectal bleeding, or alarm symptoms.  GAS / Bloating 2.   Mild constipation 3.   Flatulence 4    Colon Cancer Screening: Negative Cologuard 11/2023.  Repeat in 3  years.  Plan: - Lengthy discussion regarding treatment options. - Gave copy of low FODMAP diet to try. - Recommend avoid gas producing foods such as beans, bananas, boiled eggs, broccoli, cabbage. - Start OTC magnesium 400 mg 1 tablet once daily for constipation. - Discontinue probiotics if they are not helping. - If  magnesium does not help her constipation, then I gave samples of Linzess 72 mcg 1 capsule once daily to try for 1 week.  If that does not help, then she can call back for higher dose of Linzess 145 or 290 dose if needed. - We discussed next step is testing for SIBO if bloating persist in spite of above treatments. - Patient declines Colonoscopy.  Follow up in 6 - 8 weeks with Dr. Leigh, per patient request.  Anna Console, PA-C

## 2024-03-26 ENCOUNTER — Encounter: Payer: Self-pay | Admitting: Physician Assistant

## 2024-03-26 ENCOUNTER — Ambulatory Visit: Admitting: Physician Assistant

## 2024-03-26 VITALS — BP 116/70 | HR 82 | Ht 66.0 in | Wt 166.0 lb

## 2024-03-26 DIAGNOSIS — R143 Flatulence: Secondary | ICD-10-CM | POA: Diagnosis not present

## 2024-03-26 DIAGNOSIS — K5900 Constipation, unspecified: Secondary | ICD-10-CM

## 2024-03-26 DIAGNOSIS — R140 Abdominal distension (gaseous): Secondary | ICD-10-CM | POA: Diagnosis not present

## 2024-03-26 NOTE — Patient Instructions (Addendum)
 For Constipation: Please Start Magnesium 400mg  1 tablet once daily.  If Magnesium does not help,  Then try Sample of Linzess 72 mcg 1 tablet once daily for constipation. Linzess also comes in higher doses (145mcg and ), and we can try a higher dose if needed.  Please try the Low FODMAP diet given to help with Gas and Bloating.  Thank you for trusting me with your gastrointestinal care!   Ellouise Console, PA-C   Please follow up sooner if symptoms increase or worsen  Due to recent changes in healthcare laws, you may see the results of your imaging and laboratory studies on MyChart before your provider has had a chance to review them.  We understand that in some cases there may be results that are confusing or concerning to you. Not all laboratory results come back in the same time frame and the provider may be waiting for multiple results in order to interpret others.  Please give us  48 hours in order for your provider to thoroughly review all the results before contacting the office for clarification of your results.  _______________________________________________________  If your blood pressure at your visit was 140/90 or greater, please contact your primary care physician to follow up on this.  _______________________________________________________  If you are age 36 or older, your body mass index should be between 23-30. Your Body mass index is 26.79 kg/m. If this is out of the aforementioned range listed, please consider follow up with your Primary Care Provider.  If you are age 33 or younger, your body mass index should be between 19-25. Your Body mass index is 26.79 kg/m. If this is out of the aformentioned range listed, please consider follow up with your Primary Care Provider.   ________________________________________________________  The  GI providers would like to encourage you to use MYCHART to communicate with providers for non-urgent requests or questions.  Due  to long hold times on the telephone, sending your provider a message by [facility] may be a faster and more efficient way to get a response.  Please allow 48 business hours for a response.  Please remember that this is for non-urgent requests.  _______________________________________________________

## 2024-03-30 NOTE — Progress Notes (Signed)
 Agree with assessment and plan as outlined.  If symptoms persist despite measures as outlined, recommend colonoscopy.

## 2024-05-09 ENCOUNTER — Encounter: Payer: Self-pay | Admitting: Family Medicine

## 2024-05-09 ENCOUNTER — Ambulatory Visit: Payer: Managed Care, Other (non HMO) | Admitting: Family Medicine

## 2024-05-09 VITALS — BP 122/82 | HR 82 | Temp 98.2°F | Wt 165.0 lb

## 2024-05-09 DIAGNOSIS — Z23 Encounter for immunization: Secondary | ICD-10-CM | POA: Diagnosis not present

## 2024-05-09 DIAGNOSIS — R002 Palpitations: Secondary | ICD-10-CM | POA: Diagnosis not present

## 2024-05-09 DIAGNOSIS — E782 Mixed hyperlipidemia: Secondary | ICD-10-CM | POA: Diagnosis not present

## 2024-05-09 DIAGNOSIS — E663 Overweight: Secondary | ICD-10-CM

## 2024-05-09 DIAGNOSIS — I10 Essential (primary) hypertension: Secondary | ICD-10-CM | POA: Diagnosis not present

## 2024-05-09 MED ORDER — VERAPAMIL HCL ER 180 MG PO TBCR: 180.0000 mg | EXTENDED_RELEASE_TABLET | Freq: Every morning | ORAL | 1 refills | Status: DC

## 2024-05-09 MED ORDER — ATORVASTATIN CALCIUM 10 MG PO TABS: 10.0000 mg | ORAL_TABLET | Freq: Every day | ORAL | 4 refills | Status: DC

## 2024-05-09 NOTE — Patient Instructions (Addendum)

## 2024-05-09 NOTE — Progress Notes (Signed)
 Patient ID: Anna Mccullough, female  DOB: Jan 06, 1966, 58 y.o.   MRN: [id number] Patient Care Team    Relationship Specialty Notifications Start End  Catherine Charlies LABOR, DO PCP - General Family Medicine  10/20/19   Heide Ingle, MD Consulting Physician Orthopedic Surgery  10/23/19   Robinson Idol, MD Consulting Physician Ophthalmology  10/23/19   Darcey Lamar HERO, DPM Referring Physician Podiatry  05/09/24   Armbruster, Elspeth SQUIBB, MD Consulting Physician Gastroenterology  05/09/24   Cathlyn JAYSON Nikki Bobie FORBES, MD Consulting Physician Obstetrics and Gynecology  05/09/24     Chief Complaint  Patient presents with   Hypertension    Subjective: Anna Mccullough is a 58 y.o.  Female  present for  Chronic Conditions/illness Management All past medical history, surgical history, allergies, family history, immunizations, medications and social history were updated in the electronic medical record today. All recent labs, ED visits and hospitalizations within the last year were reviewed.  Htn/palpitations//HLD Pt reports compliance with verapamil 180 mg CR every day and Lipitor 10 mg every day.  Patient denies chest pain, shortness of breath, dizziness or lower extremity edema.   RF: Htn, HLD, bmi >25,.f/h of heart disease (father) Dr. Ladona told her pcp can manage these conditions moving forward 2025.      02/06/2024    9:39 AM 11/21/2022    8:59 AM 11/21/2021   11:04 AM 11/18/2020    9:24 AM 10/20/2019    2:33 PM  Depression screen PHQ 2/9  Decreased Interest 0 0 0 0 0  Down, Depressed, Hopeless 0 0 0 0 0  PHQ - 2 Score 0 0 0 0 0      11/21/2022    8:59 AM  GAD 7 : Generalized Anxiety Score  Nervous, Anxious, on Edge 0  Control/stop worrying 0  Worry too much - different things 0  Trouble relaxing 0  Restless 0  Easily annoyed or irritable 0  Afraid - awful might happen 0  Total GAD 7 Score 0     Immunization History  Administered Date(s) Administered   Influenza Inj  Mdck Quad Pf 06/24/2019   Influenza,inj,Quad PF,6+ Mos 06/15/2017, 07/11/2021, 06/25/2022   Influenza-Unspecified 07/24/2020, 05/28/2023   PFIZER(Purple Top)SARS-COV-2 Vaccination 10/23/2019, 11/14/2019, 06/22/2020   Rabies, IM 01/30/2019, 01/30/2019, 02/02/2019, 02/06/2019, 02/13/2019   Tdap 01/19/2020   Zoster Recombinant(Shingrix) 11/23/2023, 05/09/2024    Past Medical History:  Diagnosis Date   Acute pain of right knee 10/23/2019   Arthritis    ASCUS on Pap smear 2009   C&B 2009-BENIGN   Cervical radiculopathy 10/23/2019   c6-7 mild degenerative changes.    Elevated hemoglobin A1c 11/23/2023   6.0   HTN (hypertension)    Hyperlipidemia    Migraine    Nephrolithiasis    Rabies exposure 02/02/2019   No Known Allergies Past Surgical History:  Procedure Laterality Date   BASAL CELL CARCINOMA EXCISION     CESAREAN SECTION     CESAREAN SECTION  1999   CESAREAN SECTION  2004   COLPOSCOPY  2009   B9   KIDNEY SURGERY     REMOVAL OF KIDNEY STONES X 2   KNEE SURGERY  1988   RIGHT KNEE SURGERY   Family History  Problem Relation Age of Onset   Hypertension Mother    Cancer Father        prostate   Hypertension Father    Heart attack Father    Hyperlipidemia Father    Kidney  Stones Father    Stroke Maternal Grandmother    Heart attack Maternal Grandfather    Leukemia Paternal Grandfather    Rheum arthritis Neg Hx    Colon cancer Neg Hx    Esophageal cancer Neg Hx    Social History   Social History Narrative   Marital status/children/pets: Married.  2 children.   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   Safety:      -smoke alarm in the home:Yes     - wears seatbelt: Yes     - Feels safe in their relationships: Yes    Allergies as of 05/09/2024   No Known Allergies      Medication List        Accurate as of May 09, 2024  8:53 AM. If you have any questions, ask your nurse or doctor.          AMBULATORY NON  FORMULARY MEDICATION 2 capsules daily. Medication Name: berberine with ceylon cinnamon 1500mg  per serving   atorvastatin 10 MG tablet Commonly known as: LIPITOR Take 1 tablet (10 mg total) by mouth daily.   estradiol 0.05 MG/24HR patch Commonly known as: VIVELLE-DOT Place 1 patch (0.05 mg total) onto the skin 2 (two) times a week.   GINGER PO Take by mouth.   PROBIOTIC BLEND PO Take by mouth.   progesterone 100 MG capsule Commonly known as: Prometrium Take 1 capsule (100 mg total) by mouth daily. Take at bedtime.   QC TUMERIC COMPLEX PO Take 2 capsules by mouth daily.   verapamil 180 MG CR tablet Commonly known as: CALAN-SR Take 1 tablet (180 mg total) by mouth every morning.   VITAMIN D (CHOLECALCIFEROL) PO Take 1,000 Units by mouth daily. Naturemade D3        All past medical history, surgical history, allergies, family history, immunizations andmedications were updated in the EMR today and reviewed under the history and medication portions of their EMR.      No results found.   ROS 14 pt review of systems performed and negative (unless mentioned in an HPI)  Objective: BP 122/82   Pulse 82   Temp 98.2 F (36.8 C)   Wt 165 lb (74.8 kg)   LMP 08/31/2021 (Approximate)   SpO2 97%   BMI 26.63 kg/m  Physical Exam Vitals and nursing note reviewed.  Constitutional:      General: She is not in acute distress.    Appearance: Normal appearance. She is not ill-appearing, toxic-appearing or diaphoretic.  HENT:     Head: Normocephalic and atraumatic.  Eyes:     General: No scleral icterus.       Right eye: No discharge.        Left eye: No discharge.     Extraocular Movements: Extraocular movements intact.     Conjunctiva/sclera: Conjunctivae normal.     Pupils: Pupils are equal, round, and reactive to light.  Cardiovascular:     Rate and Rhythm: Normal rate and regular rhythm.  Pulmonary:     Effort: Pulmonary effort is normal. No respiratory distress.      Breath sounds: Normal breath sounds. No wheezing, rhonchi or rales.  Musculoskeletal:     Right lower leg: No edema.     Left lower leg: No edema.  Skin:    General: Skin is warm.     Findings: No rash.  Neurological:     Mental Status: She is alert and oriented to person, place, and time.  Mental status is at baseline.     Motor: No weakness.     Gait: Gait normal.  Psychiatric:        Mood and Affect: Mood normal.        Behavior: Behavior normal.        Thought Content: Thought content normal.        Judgment: Judgment normal.     No results found.  Assessment/plan: LATORIA DRY is a 58 y.o. female present for Chronic Conditions/illness Management HTN/paliptations/bmi>25/Mixed hyperlipidemia/fh heart disease/ Stable Continue lipitor 10 mg every day Continue verapamil 180 mg daily Been on propranolol for tachycardia in the past, now prescribed verapamil by cardiology   Need for shingles vaccine Shingrix series completed today.    Return in about 7 months (around 11/24/2024) for cpe (20 min), Routine chronic condition follow-up.  Orders Placed This Encounter  Procedures   Varicella-zoster vaccine IM   Meds ordered this encounter  Medications   verapamil (CALAN-SR) 180 MG CR tablet    Sig: Take 1 tablet (180 mg total) by mouth every morning.    Dispense:  90 tablet    Refill:  1   atorvastatin (LIPITOR) 10 MG tablet    Sig: Take 1 tablet (10 mg total) by mouth daily.    Dispense:  90 tablet    Refill:  4   Referral Orders  No referral(s) requested today   Electronically signed by: Charlies Bellini, DO [facility] [place]

## 2024-05-30 NOTE — Progress Notes (Signed)
 HPI :  58 year old female here for a follow-up visit for abdominal gas and bloating, altered bowel habits.  This is my first time seeing her, she was seen by Ellouise Console in the office on July 2.  She reports having abdominal bloating and gas that been bothersome for her for the past 1 to 2 years at least.  She feels as though after she went through menopause the symptoms have really bothered her.  Historically she have 1 bowel movement per day at the same time, with the same form.  That has since been altered, she has a bowel movement once daily but timing of this can vary as well as stool form.  She feels as though even though she has a bowel movement she feels sense of incomplete evacuation.  She denies any blood in her stools.  She was treated at the last visit with some Linzess to see if moving her bowels more frequently would improve her bloating but she states it just led to severe diarrhea and did not help her at all, led to abdominal cramps and she stopped it.  She has been using Gas-X a few pills daily and that has helped reduce some of the bloating and discomfort she has been having.  She does find relief with a bowel movement but it tends to be self-limited and recurs over the course of the day.  She had a handout about a low FODMAP diet but denies any clear food triggers, other than her bloating tends to get worse over the course of the day as she is eating more.  She has never had a colonoscopy but had a Cologuard that was negative back in March.  She has no family history of colon cancer.  She did go on HRT by her gynecologist this past summer, but does not think this has made any difference in her abdominal symptoms.  She has felt probiotics in the past.  No weight loss.  She has had normal CBC, c-Met, TSH back in February.   Past Medical History:  Diagnosis Date   Acute pain of right knee 10/23/2019   Arthritis    ASCUS on Pap smear 2009   C&B 2009-BENIGN   Cervical radiculopathy  10/23/2019   c6-7 mild degenerative changes.    Elevated hemoglobin A1c 11/23/2023   6.0   HTN (hypertension)    Hyperlipidemia    Migraine    Nephrolithiasis    Rabies exposure 02/02/2019     Past Surgical History:  Procedure Laterality Date   BASAL CELL CARCINOMA EXCISION     CESAREAN SECTION     CESAREAN SECTION  1999   CESAREAN SECTION  2004   COLPOSCOPY  2009   B9   KIDNEY SURGERY     REMOVAL OF KIDNEY STONES X 2   KNEE SURGERY  1988   RIGHT KNEE SURGERY   Family History  Problem Relation Age of Onset   Hypertension Mother    Cancer Father        prostate   Hypertension Father    Heart attack Father    Hyperlipidemia Father    Kidney Stones Father    Stroke Maternal Grandmother    Heart attack Maternal Grandfather    Leukemia Paternal Grandfather    Rheum arthritis Neg Hx    Colon cancer Neg Hx    Esophageal cancer Neg Hx    Social History   Tobacco Use   Smoking status: Never   Smokeless tobacco: Never  Vaping Use   Vaping status: Never Used  Substance Use Topics   Alcohol use: Yes    Comment: Occasional   Drug use: No   Current Outpatient Medications  Medication Sig Dispense Refill   atorvastatin  (LIPITOR) 10 MG tablet Take 1 tablet (10 mg total) by mouth daily. 90 tablet 4   estradiol  (VIVELLE -DOT) 0.05 MG/24HR patch Place 1 patch (0.05 mg total) onto the skin 2 (two) times a week. 24 patch 1   progesterone  (PROMETRIUM ) 100 MG capsule Take 1 capsule (100 mg total) by mouth daily. Take at bedtime. 90 capsule 1   Simethicone (GAS-X EXTRA STRENGTH PO) Take 2 tablets by mouth 2 (two) times daily.     verapamil  (CALAN -SR) 180 MG CR tablet Take 1 tablet (180 mg total) by mouth every morning. 90 tablet 1   VITAMIN D, CHOLECALCIFEROL, PO Take 1,000 Units by mouth daily. Naturemade D3     No current facility-administered medications for this visit.   No Known Allergies   Review of Systems: All systems reviewed and negative except where noted in HPI.    Lab Results  Component Value Date   WBC 7.6 11/23/2023   HGB 14.3 11/23/2023   HCT 43.4 11/23/2023   MCV 91.3 11/23/2023   PLT 254.0 11/23/2023    Lab Results  Component Value Date   NA 141 11/23/2023   CL 104 11/23/2023   K 4.3 11/23/2023   CO2 30 11/23/2023   BUN 20 11/23/2023   CREATININE 1.03 11/23/2023   GFR 61.22 11/23/2023   CALCIUM  9.5 11/23/2023   ALBUMIN 4.3 11/23/2023   GLUCOSE 93 11/23/2023    Lab Results  Component Value Date   ALT 19 11/23/2023   AST 20 11/23/2023   ALKPHOS 88 11/23/2023   BILITOT 0.5 11/23/2023     Physical Exam: BP 124/72   Pulse 78   Ht 5' 6 (1.676 m)   Wt 167 lb 2 oz (75.8 kg)   LMP 08/31/2021 (Approximate)   BMI 26.97 kg/m  Constitutional: Pleasant,well-developed, female in no acute distress. Abdominal: Soft, nondistended, nontender. There are no masses palpable. Neurological: Alert and oriented to person place and time. Psychiatric: Normal mood and affect. Behavior is normal.   ASSESSMENT: 58 y.o. female here for assessment of the following  1. Bloating symptom   2. Altered bowel habits    Discussed her symptoms as outlined above.  No real clear food triggers, does get some self-limited short-term relief with a bowel movement but tends to get abdominal bloating throughout the course of the day.  She has had some sense of change in stool form and timing of her bowel movements etc during this time.  Unfortunately Linzess led to diarrhea and did not provide any benefit.  Her labs are normal and reassuring, no alarm symptoms, she had a negative Cologuard in recent months.  However, we did discuss colon cancer screening, and in the setting of the symptoms symptoms, optical colonoscopy is recommended for colon cancer screening and not Cologuard.  With her symptoms I recommend a colonoscopy however she is hoping to avoid that if possible.  We did discuss the difference between optical colonoscopy and Cologuard for colon cancer  screening.   We discussed some other measures we can take to help manage her symptoms.  I would like to screen her for celiac disease to make sure negative, she will go to the lab to be evaluated for that today.  For her altered stool form and habits, we  will try Citrucel once daily to provide some regularity.  Told her to specifically use Citrucel and avoid Metamucil/psyllium husk as that could make bloating worse.  We have some free samples of Fodzyme to use in her food for a few days to see if this makes any difference, also had some samples of IBgard to use as needed for bloating that is causing pain.  If Gas-X provides benefit she can continue to take that.  Otherwise, the only medication change during this time in relation to her bowels is verapamil .  Sometimes that can lead to constipation or altered bowel habits.  Not sure if that is related at all.  It does not appear that HRT has provided any benefit to the symptoms.  We discussed that bowel function can change over time and bloating is very common, however again, with her symptoms if she has never had a colonoscopy that would be recommended.  Her preference is to do the blood work, and see how she does with conservative measures first as outlined.  If her symptoms persist she is willing to do colonoscopy.  I asked her to touch base with me in 3 to 4 weeks for an update and let me know how she is doing.  Otherwise, if symptoms progress or worsen, consider cross-sectional imaging and could consider empiric course of rifaximin as well  PLAN: - lab today - celiac serology and IgA level - start Citrucel once daily - samples of Fodzyme given to try - samples of IB gard given to use PRN - continue gas-ex PRN - recommended colonoscopy - she wants to hold off right now. Recommended especially if symptoms do not improve - contact me in 3-4 weeks with an update to let me know how she is doing - if no improvement consideration for empiric Rifaximin and  colonoscopy or cross sectional imaging  Marcey Naval, MD [facility] Gastroenterology

## 2024-05-30 NOTE — Progress Notes (Signed)
 GYNECOLOGY  VISIT   HPI: 57 y.o.   Married  Caucasian female   G2P2002 with Patient's last menstrual period was 08/31/2021 (approximate).   here for: 3 month follow up - Vivelle  & progesterone . Pt is PMP but randomly had a period 05/27/24 - 06/01/24.  No other bleeding or spotting.  No missed dosages.  She has a current and new 90 day Rx for her estrogen and her progesterone , from her local pharmacy.  She will need refills to [facility] for future hormones.   FSH 72.7 and estradiol  <15 on 02/21/24.  She stopped Micronor  after these levels were back, and she started on HRT then.   Sleeping better at night.  No hot flashes or night sweats.    Still has bloating, but it has not worsened. Saw Dr. Leigh this week.   Undergoing evaluation.   Linzess did not help in the past.   Takes Gas-Ex with meals.   LMP was 08/31/21.   GYNECOLOGIC HISTORY: Patient's last menstrual period was 08/31/2021 (approximate). 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    Elevated hemoglobin A1c 11/23/2023   6.0   HTN (hypertension)    Hyperlipidemia    Migraine    Nephrolithiasis    Rabies exposure 02/02/2019     Past Surgical History:  Procedure Laterality Date   BASAL CELL CARCINOMA EXCISION     CESAREAN SECTION     CESAREAN SECTION  1999   CESAREAN SECTION  2004   COLPOSCOPY  2009   B9   KIDNEY SURGERY     REMOVAL OF KIDNEY STONES X 2   KNEE SURGERY  1988   RIGHT KNEE SURGERY    Current Outpatient Medications  Medication Sig Dispense Refill   AMBULATORY NON FORMULARY MEDICATION Medication Name: FODZYMES- use as directed     atorvastatin  (LIPITOR) 10 MG tablet Take 1 tablet (10 mg total) by mouth daily. 90 tablet 4   Peppermint Oil (IBGARD) 90 MG CPCR Take as directed 8 capsule 0   Simethicone (GAS-X EXTRA STRENGTH PO) Take 2 tablets by mouth 2 (two) times daily.     verapamil  (CALAN -SR) 180 MG CR tablet Take 1 tablet (180 mg total) by mouth every morning. 90 tablet 1   VITAMIN D, CHOLECALCIFEROL, PO Take 1,000 Units by mouth daily. Naturemade D3     [  START ON 06/05/2024] estradiol  (VIVELLE -DOT) 0.05 MG/24HR patch Place 1 patch (0.05 mg total) onto the skin 2 (two) times a week. 24 patch 1   methylcellulose (CITRUCEL) oral powder Take 1 packet by mouth daily. (Patient not taking: Reported on 06/04/2024)     progesterone  (PROMETRIUM ) 100 MG capsule Take 1 capsule (100 mg total) by mouth daily. Take at bedtime. 90 capsule 1   No current facility-administered medications for this visit.     ALLERGIES: Patient has no known allergies.  Family History  Problem Relation Age of Onset   Hypertension Mother    Cancer Father        prostate   Hypertension Father    Heart attack Father    Hyperlipidemia Father    Kidney Stones Father    Stroke Maternal Grandmother    Heart attack Maternal Grandfather    Leukemia Paternal Grandfather    Rheum arthritis Neg Hx    Colon cancer Neg Hx    Esophageal cancer Neg Hx     Social History   Socioeconomic History   Marital status: Married    Spouse name: Not on file   Number of children: 2   Years of education: Not on file   Highest  education level: Not on file  Occupational History   Not on file  Tobacco Use   Smoking status: Never   Smokeless tobacco: Never  Vaping Use   Vaping status: Never Used  Substance and Sexual Activity   Alcohol use: Yes    Comment: Occasional   Drug use: No   Sexual activity: Not Currently    Partners: Male    Birth control/protection: Pill  Other Topics Concern   Not on file  Social History Narrative   Marital status/children/pets: Married.  2 children.   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   Safety:      -smoke alarm in the home:Yes     - wears seatbelt: Yes     - Feels safe in their relationships: Yes   Social Drivers of Corporate investment banker Strain: Low Risk  (04/26/2024)   Received from [facility]   Overall Financial Resource Strain (CARDIA)    How hard is it for you to pay for the very basics like food, housing, medical care, and heating?: Not hard at all  Food Insecurity: No Food Insecurity (04/26/2024)   Received from [facility]   Hunger Vital Sign    Within the past 12 months, you worried that your food would run out before you got the money to buy more.: Never true    Within the past 12 months, the food you bought just didn't last and you didn't have money to get more.: Never true  Transportation Needs: No Transportation Needs (04/26/2024)   Received from [facility] - Transportation    In the past 12 months, has lack of transportation kept you from medical appointments or from getting medications?: No    In the past 12 months, has lack of transportation kept you from meetings, work, or from getting things needed for daily living?: No  Physical Activity: Insufficiently Active (04/26/2024)   Received from [facility]   Exercise Vital Sign    On average, how many days per week do you engage in moderate to strenuous exercise (like a brisk walk)?: 1 day    On average, how many minutes do you engage in  exercise at this level?: 30  min  Stress: No Stress Concern Present (04/26/2024)   Received from [facility] - Occupational Stress Questionnaire    Do you feel stress - tense, restless, nervous, or anxious, or unable to sleep at night because your mind is troubled all the time - these days?: Not at all  Social Connections: Moderately Integrated (04/26/2024)   Received from [facility]   Social Network    How would you rate your social network (family, work, friends)?: Adequate participation with social networks  Intimate Partner Violence: Not At Risk (04/26/2024)   Received from [facility]   HITS    Over the last 12 months how often did your partner physically hurt you?: Never    Over the last 12 months how often did your partner insult you or talk down to you?: Patient declined    Over the last 12 months how often did your partner threaten you with physical harm?: Never    Over the last 12 months how often did your partner scream or curse at you?: Patient declined    Review of Systems  Genitourinary:  Positive for vaginal bleeding.    PHYSICAL EXAMINATION:   BP 122/78 (BP Location: Left Arm, Patient Position: Sitting, Cuff Size: Normal)   Pulse 75   Ht 5' 6 (1.676 m)   Wt 164 lb (74.4 kg)   LMP 08/31/2021 (Approximate)   SpO2 99%   BMI 26.47 kg/m     General appearance: alert, cooperative and appears stated age   Pelvic: External genitalia:  no lesions              Urethra:  normal appearing urethra with no masses, tenderness or lesions              Bartholins and Skenes: normal                 Vagina: normal appearing vagina with normal color and discharge, no lesions              Cervix: no lesions                Bimanual Exam:  Uterus:  normal size, contour, position, consistency, mobility, non-tender              Adnexa: no mass, fullness, tenderness              Chaperone was present for exam:  Kari HERO,  CMA  ASSESSMENT:  Uterine bleeding.  Uncertain if this was a period or postmenopausal bleeding.  HRT.   Sleep quality improved Encounter for medication monitoring.   PLAN:  Will check FSH and estradiol  to see if the bleeding was a period or is postmenopausal bleeding.  Vivelle  Dot 0.05 mg twice weekly.  #24, RF one.  Prometrium  100 mg q hs, #90, RF one.  If postmenopausal bleeding, patient will need cervical cancer screening, a pelvic ultrasound, and a possible endometrial biopsy.  Follow up for annual exam and prn.   28 min  total time was spent for this patient encounter, including preparation, face-to-face counseling with the patient, coordination of care, and documentation of the encounter.

## 2024-06-02 ENCOUNTER — Other Ambulatory Visit

## 2024-06-02 ENCOUNTER — Encounter: Payer: Self-pay | Admitting: Gastroenterology

## 2024-06-02 ENCOUNTER — Ambulatory Visit: Admitting: Gastroenterology

## 2024-06-02 VITALS — BP 124/72 | HR 78 | Ht 66.0 in | Wt 167.1 lb

## 2024-06-02 DIAGNOSIS — R194 Change in bowel habit: Secondary | ICD-10-CM | POA: Diagnosis not present

## 2024-06-02 DIAGNOSIS — R140 Abdominal distension (gaseous): Secondary | ICD-10-CM | POA: Diagnosis not present

## 2024-06-02 MED ORDER — AMBULATORY NON FORMULARY MEDICATION: Status: AC

## 2024-06-02 MED ORDER — IBGARD 90 MG PO CPCR: ORAL_CAPSULE | ORAL | 0 refills | Status: AC

## 2024-06-02 MED ORDER — CITRUCEL PO POWD: 1.0000 | Freq: Every day | ORAL | Status: DC

## 2024-06-02 NOTE — Patient Instructions (Addendum)
 It has been recommended to you by your physician that you have a(n) colonoscopy completed. Per your request, we did not schedule the procedure(s) today. Please contact our office at [phone] should you decide to have the procedure completed. You will be scheduled for a pre-visit and procedure at that time.  Please go to the lab in the basement of our building to have lab work done as you leave today. Hit B for basement when you get on the elevator.  When the doors open the lab is on your left.  We will call you with the results. Thank you.  We have given you samples of the following medication to take:  IBGard - use as directed FODZYMES   Please purchase the following medications over the counter and take as directed:  Citrucel - take once daily  Continue Gas-X  Please let us  know how you're doing in a couple of weeks and if not improved we can discuss net steps.  Thank you for entrusting me with your care and for choosing [facility], Dr. Elspeth Naval    _______________________________________________________  If your blood pressure at your visit was 140/90 or greater, please contact your primary care physician to follow up on this.  _______________________________________________________  If you are age 66 or older, your body mass index should be between 23-30. Your Body mass index is 26.97 kg/m. If this is out of the aforementioned range listed, please consider follow up with your Primary Care Provider.  If you are age 5 or younger, your body mass index should be between 19-25. Your Body mass index is 26.97 kg/m. If this is out of the aformentioned range listed, please consider follow up with your Primary Care Provider.   ________________________________________________________  The [facility] providers would like to encourage you to use MYCHART to communicate with providers for non-urgent requests or questions.  Due to long hold times on the telephone, sending  your provider a message by [facility] may be a faster and more efficient way to get a response.  Please allow 48 business hours for a response.  Please remember that this is for non-urgent requests.  _______________________________________________________  [facility] is using a team-based approach to care.  Your team is made up of your doctor and two to three APPS. Our APPS (Nurse Practitioners and Physician Assistants) work with your physician to ensure care continuity for you. They are fully qualified to address your health concerns and develop a treatment plan. They communicate directly with your gastroenterologist to care for you. Seeing the Advanced Practice Practitioners on your physician's team can help you by facilitating care more promptly, often allowing for earlier appointments, access to diagnostic testing, procedures, and other specialty referrals.

## 2024-06-03 LAB — IGA: Immunoglobulin A: 116 mg/dL (ref 47–310)

## 2024-06-03 LAB — TISSUE TRANSGLUTAMINASE, IGA: (tTG) Ab, IgA: <1 U/mL

## 2024-06-04 ENCOUNTER — Ambulatory Visit: Admitting: Obstetrics and Gynecology

## 2024-06-04 ENCOUNTER — Encounter: Payer: Self-pay | Admitting: Obstetrics and Gynecology

## 2024-06-04 ENCOUNTER — Ambulatory Visit: Payer: Self-pay | Admitting: Gastroenterology

## 2024-06-04 VITALS — BP 122/78 | HR 75 | Ht 66.0 in | Wt 164.0 lb

## 2024-06-04 DIAGNOSIS — Z5181 Encounter for therapeutic drug level monitoring: Secondary | ICD-10-CM | POA: Diagnosis not present

## 2024-06-04 DIAGNOSIS — Z79890 Hormone replacement therapy (postmenopausal): Secondary | ICD-10-CM | POA: Diagnosis not present

## 2024-06-04 DIAGNOSIS — N939 Abnormal uterine and vaginal bleeding, unspecified: Secondary | ICD-10-CM

## 2024-06-04 LAB — FOLLICLE STIMULATING HORMONE: FSH: 44.6 m[IU]/mL

## 2024-06-04 LAB — ESTRADIOL: Estradiol: 45 pg/mL

## 2024-06-04 MED ORDER — ESTRADIOL 0.05 MG/24HR TD PTTW: 1.0000 | MEDICATED_PATCH | TRANSDERMAL | 1 refills | Status: DC

## 2024-06-04 MED ORDER — PROGESTERONE MICRONIZED 100 MG PO CAPS: 100.0000 mg | ORAL_CAPSULE | Freq: Every day | ORAL | 1 refills | Status: DC

## 2024-06-05 ENCOUNTER — Ambulatory Visit: Payer: Self-pay | Admitting: Obstetrics and Gynecology

## 2024-06-05 DIAGNOSIS — N950 Postmenopausal bleeding: Secondary | ICD-10-CM

## 2024-06-10 ENCOUNTER — Ambulatory Visit

## 2024-06-10 DIAGNOSIS — N950 Postmenopausal bleeding: Secondary | ICD-10-CM | POA: Diagnosis not present

## 2024-06-11 NOTE — Progress Notes (Signed)
 GYNECOLOGY  VISIT   HPI: 58 y.o.   Married  Caucasian female   G2P2002 with Patient's last menstrual period was 08/31/2021 (approximate).   here for: Pap with possible endometrial biopsy.  Bled after her pelvic ultrasound.  She has had postmenopausal bleeding on her HRT.      FSH 44.6 Estradiol  45 (On HRT)  GYNECOLOGIC HISTORY: Patient's last menstrual period was 08/31/2021 (approximate). 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    Elevated hemoglobin A1c 11/23/2023   6.0   HTN (hypertension)    Hyperlipidemia    Migraine    Nephrolithiasis    Rabies exposure 02/02/2019    Past Surgical History:  Procedure Laterality Date   BASAL CELL CARCINOMA EXCISION     CESAREAN SECTION  1999   CESAREAN SECTION  2004   COLPOSCOPY  2009   B9   KIDNEY SURGERY     REMOVAL OF KIDNEY STONES X 2   KNEE SURGERY  1988   RIGHT KNEE SURGERY    Current Outpatient Medications  Medication Sig Dispense Refill   AMBULATORY NON FORMULARY MEDICATION Medication Name: FODZYMES- use as directed     atorvastatin  (LIPITOR) 10 MG tablet Take 1 tablet (10 mg total) by mouth daily. 90 tablet 4   estradiol   (VIVELLE -DOT) 0.05 MG/24HR patch Place 1 patch (0.05 mg total) onto the skin 2 (two) times a week. 24 patch 1   Peppermint Oil (IBGARD) 90 MG CPCR Take as directed 8 capsule 0   progesterone  (PROMETRIUM ) 100 MG capsule Take 1 capsule (100 mg total) by mouth daily. Take at bedtime. 90 capsule 1   Simethicone (GAS-X EXTRA STRENGTH PO) Take 2 tablets by mouth 2 (two) times daily.     verapamil  (CALAN -SR) 180 MG CR tablet Take 1 tablet (180 mg total) by mouth every morning. 90 tablet 1   VITAMIN D, CHOLECALCIFEROL, PO Take 1,000 Units by mouth daily. Naturemade D3     methylcellulose (CITRUCEL) oral powder Take 1 packet by mouth daily. (Patient not taking: Reported on 06/12/2024)     No current facility-administered medications for this visit.     ALLERGIES: Patient has no known allergies.  Family History  Problem Relation Age of Onset   Hypertension Mother    Cancer Father        prostate   Hypertension  Father    Heart attack Father    Hyperlipidemia Father    Kidney Stones Father    Stroke Maternal Grandmother    Heart attack Maternal Grandfather    Leukemia Paternal Grandfather    Rheum arthritis Neg Hx    Colon cancer Neg Hx    Esophageal cancer Neg Hx     Social History   Socioeconomic History   Marital status: Married    Spouse name: Not on file   Number of children: 2   Years of education: Not on file   Highest education level: Not on file  Occupational History   Not on file  Tobacco Use   Smoking status: Never   Smokeless tobacco: Never  Vaping Use   Vaping status: Never Used  Substance and Sexual Activity   Alcohol use: Yes    Comment: Occasional   Drug use: No   Sexual activity: Not Currently    Partners: Male    Birth control/protection: Pill  Other Topics Concern   Not on file  Social History Narrative   Marital status/children/pets: Married.  2 children.   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   Safety:       -smoke alarm in the home:Yes     - wears seatbelt: Yes     - Feels safe in their relationships: Yes   Social Drivers of Corporate investment banker Strain: Low Risk  (04/26/2024)   Received from [facility]   Overall Financial Resource Strain (CARDIA)    How hard is it for you to pay for the very basics like food, housing, medical care, and heating?: Not hard at all  Food Insecurity: No Food Insecurity (04/26/2024)   Received from [facility]   Hunger Vital Sign    Within the past 12 months, you worried that your food would run out before you got the money to buy more.: Never true    Within the past 12 months, the food you bought just didn't last and you didn't have money to get more.: Never true  Transportation Needs: No Transportation Needs (04/26/2024)   Received from [facility] - Transportation    In the past 12 months, has lack of transportation kept you from medical appointments or from getting medications?: No    In the past 12 months, has lack of transportation kept you from meetings, work, or from getting things needed for daily living?: No  Physical Activity: Insufficiently Active (04/26/2024)   Received from [facility]   Exercise Vital Sign    On average, how many days per week do you engage in moderate to strenuous exercise (like a brisk walk)?: 1 day    On average, how many minutes do you engage in exercise at this level?: 30 min  Stress: No Stress Concern Present (04/26/2024)   Received from [facility] - Occupational Stress Questionnaire    Do you feel stress - tense, restless, nervous, or anxious, or unable to sleep at night because your mind is troubled all the time - these days?: Not at all  Social Connections: Moderately Integrated (04/26/2024)   Received from [facility]   Social Network    How would you rate your social network (family, work, friends)?: Adequate participation with social networks  Intimate  Partner Violence: Not At Risk (04/26/2024)   Received from [facility]   HITS    Over the last  12 months how often did your partner physically hurt you?: Never    Over the last 12 months how often did your partner insult you or talk down to you?: Patient declined    Over the last 12 months how often did your partner threaten you with physical harm?: Never    Over the last 12 months how often did your partner scream or curse at you?: Patient declined    Review of Systems  All other systems reviewed and are negative.   PHYSICAL EXAMINATION:   BP 126/86 (BP Location: Left Arm, Patient Position: Sitting)   Pulse 85   LMP 08/31/2021 (Approximate)   SpO2 98%     General appearance: alert, cooperative and appears stated age   Pelvic US  Uterus:  7.06 x 4.36 x 4.17 cm.  Fibroids:  intramural - 0.80 cm, 0.47 cm, 0.66 cm.  Cystic myometrium.  EMS 4.73 mm.  Cystic.  Avascular.  Left ovary 1.74 x 1.09 x 0.91 cm.  Right ovary 2.28 x 1.40 x 1.09 cm.  No adnexal masses.  No free fluid.  Dilated vessels in the uterus and left adnexa.    Blood noted today with speculum exam.    Endometrial biopsy.  Consent and time out done. Hibiclens prep.  Local 1% lidocaine, 10 cc for paracervical block. Lot [id number], exp Feb. 2028 Tenaculum to anterior cervical lip.  Pipelle passed to 7 cm x 2.  Tissue to pathology.  No complications.  Minimal EBL.   Chaperone was present for exam:  Kari HERO, CMA  ASSESSMENT:  Postmenopausal bleeding on HRT.  Endometrial change.  Myometrial cysts. Fibroids.  Left adnexal vessels dilated consistent with possible pelvic congestion.   PLAN:  Pelvic US  findings reviewed.  Fibroids discussed.  Pap not collected.  FU EMB.  Final plan to follow.  May need to increase her progesterone  dosage.   20 min  total time was spent for this patient encounter, including preparation, face-to-face counseling with the patient, coordination of care, and documentation of the  encounter in addition to doing the endometrial biopsy.

## 2024-06-12 ENCOUNTER — Other Ambulatory Visit (HOSPITAL_COMMUNITY)
Admission: RE | Admit: 2024-06-12 | Discharge: 2024-06-12 | Disposition: A | Discharge status: Home / Self Care | Source: Ambulatory Visit | Attending: Obstetrics and Gynecology | Admitting: Obstetrics and Gynecology

## 2024-06-12 ENCOUNTER — Ambulatory Visit: Admitting: Obstetrics and Gynecology

## 2024-06-12 ENCOUNTER — Encounter: Payer: Self-pay | Admitting: Obstetrics and Gynecology

## 2024-06-12 VITALS — BP 126/86 | HR 85

## 2024-06-12 DIAGNOSIS — N950 Postmenopausal bleeding: Secondary | ICD-10-CM | POA: Diagnosis present

## 2024-06-12 DIAGNOSIS — D219 Benign neoplasm of connective and other soft tissue, unspecified: Secondary | ICD-10-CM

## 2024-06-12 DIAGNOSIS — Z79890 Hormone replacement therapy (postmenopausal): Secondary | ICD-10-CM

## 2024-06-12 NOTE — Patient Instructions (Signed)
 Endometrial Biopsy  An endometrial biopsy is a procedure where a tissue sample is removed from the lining of the uterus. This lining is called the endometrium. The tissue sample is then sent to a lab for testing. You may have this type of biopsy to check for: Cancer. Infection. Growths called polyps. Uterine bleeding that can't be explained. Tell a health care provider about: Any allergies you have. All medicines you're taking including vitamins, herbs, eye drops, creams, and over-the-counter medicines. Any problems you or family members have had with anesthesia. Any bleeding problems you have. Any surgeries you have had. Any medical problems you have. Whether you're pregnant or may be pregnant. What are the risks? Your health care provider will talk with you about risks. These may include: Bleeding. Infection. Allergic reactions to medicines. Damage to the wall of the uterus. This is rare. What happens before the procedure? Keep track of your period. You may need to have this biopsy when you're not having your period. Ask your provider about: Changing or stopping your regular medicines. These include any diabetes medicines or blood thinners you take. Taking medicines such as aspirin and ibuprofen. These medicines can thin your blood. Do not take them unless your provider tells you to. Taking over-the-counter medicines, vitamins, herbs, and supplements. Bring a pad with you. You may need to wear one after the biopsy. Plan to have a responsible adult take you home from the hospital or clinic. You won't be allowed to drive. What happens during the procedure? A tool will be put into your vagina to hold it open. This helps your provider see the cervix. The cervix is the lowest part of the uterus. Your cervix will be cleaned with a solution that kills germs. You will be given anesthesia. This keeps you from feeling pain. It will numb your cervix. A tool called forceps will be used to  hold your cervix steady. A thin tool called a uterine sound will be put through your cervix. It will be used to: Find the length of your uterus. Find where to take the sample from. A soft tube called a catheter will be put into your uterus. The catheter will remove a tissue sample. The tube and tools will be removed. The sample will be sent to a lab for testing. The procedure may vary among providers and hospitals. What happens after the procedure? Your blood pressure, heart rate, breathing rate, and blood oxygen level will be monitored until you leave the hospital or clinic. It's up to you to get the results of your procedure. Ask your provider, or the department that is doing the procedure, when your results will be ready. This information is not intended to replace advice given to you by your health care provider. Make sure you discuss any questions you have with your health care provider. Document Revised: 11/21/2022 Document Reviewed: 11/21/2022 Elsevier Patient Education  2024 [employer].

## 2024-06-16 LAB — SURGICAL PATHOLOGY

## 2024-06-17 ENCOUNTER — Other Ambulatory Visit: Payer: Self-pay | Admitting: Obstetrics and Gynecology

## 2024-06-17 ENCOUNTER — Ambulatory Visit: Payer: Self-pay | Admitting: Obstetrics and Gynecology

## 2024-06-17 MED ORDER — PROGESTERONE MICRONIZED 100 MG PO CAPS: ORAL_CAPSULE | ORAL | Status: DC

## 2024-06-17 MED ORDER — PROGESTERONE 200 MG PO CAPS: 200.0000 mg | ORAL_CAPSULE | Freq: Every day | ORAL | 2 refills | Status: DC

## 2024-07-13 ENCOUNTER — Encounter: Payer: Self-pay | Admitting: Family Medicine

## 2024-07-16 ENCOUNTER — Encounter: Payer: Self-pay | Admitting: Family Medicine

## 2024-07-16 ENCOUNTER — Other Ambulatory Visit: Payer: Self-pay | Admitting: Family Medicine

## 2024-07-16 ENCOUNTER — Ambulatory Visit: Admitting: Family Medicine

## 2024-07-16 VITALS — BP 126/80 | HR 83 | Temp 98.4°F | Wt 162.0 lb

## 2024-07-16 DIAGNOSIS — Z23 Encounter for immunization: Secondary | ICD-10-CM | POA: Diagnosis not present

## 2024-07-16 DIAGNOSIS — I872 Venous insufficiency (chronic) (peripheral): Secondary | ICD-10-CM

## 2024-07-16 MED ORDER — FLUOCINONIDE 0.05 % EX OINT: 1.0000 | TOPICAL_OINTMENT | Freq: Two times a day (BID) | CUTANEOUS | 5 refills | Status: DC

## 2024-07-16 NOTE — Progress Notes (Signed)
 Anna Mccullough , 09-13-1966, 58 y.o., female MRN: [id number] Patient Care Team    Relationship Specialty Notifications Start End  Catherine Charlies LABOR, DO PCP - General Family Medicine  10/20/19   Heide Ingle, MD Consulting Physician Orthopedic Surgery  10/23/19   Robinson Idol, MD Consulting Physician Ophthalmology  10/23/19   Darcey Lamar HERO, DPM Referring Physician Podiatry  05/09/24   Armbruster, Elspeth SQUIBB, MD Consulting Physician Gastroenterology  05/09/24   Cathlyn JAYSON Nikki Bobie FORBES, MD Consulting Physician Obstetrics and Gynecology  05/09/24     Chief Complaint  Patient presents with   Rash    Since Saturday; Both legs. Painful. Noticed after standing in the sun. Pt has tried shea butter lotion.      Subjective: Anna Mccullough is a 58 y.o. Pt presents for an OV with complaints of rash of a week  duration.  Associated symptoms include itchy red rash, mild swelling in legs and pain. Patient reports she does wear compression stockings when able.  She has been using a Shea butter cream. No fevers or chills.  No drainage or rash.     02/06/2024    9:39 AM 11/21/2022    8:59 AM 11/21/2021   11:04 AM 11/18/2020    9:24 AM 10/20/2019    2:33 PM  Depression screen PHQ 2/9  Decreased Interest 0 0 0 0 0  Down, Depressed, Hopeless 0 0 0 0 0  PHQ - 2 Score 0 0 0 0 0    No Known Allergies Social History   Social History Narrative   Marital status/children/pets: Married.  2 children.   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   Safety:      -smoke alarm in the home:Yes     - wears seatbelt: Yes     - Feels safe in their relationships: Yes   Past Medical History:  Diagnosis Date   Acute pain of right knee 10/23/2019   Arthritis    ASCUS on Pap smear 2009   C&B 2009-BENIGN   Cervical radiculopathy 10/23/2019   c6-7 mild degenerative changes.    Elevated hemoglobin A1c 11/23/2023   6.0   HTN (hypertension)    Hyperlipidemia     Migraine    Nephrolithiasis    Rabies exposure 02/02/2019   Past Surgical History:  Procedure Laterality Date   BASAL CELL CARCINOMA EXCISION     CESAREAN SECTION  1999   CESAREAN SECTION  2004   COLPOSCOPY  2009   B9   KIDNEY SURGERY     REMOVAL OF KIDNEY STONES X 2   KNEE SURGERY  1988   RIGHT KNEE SURGERY   Family History  Problem Relation Age of Onset   Hypertension Mother    Cancer Father        prostate   Hypertension Father    Heart attack Father    Hyperlipidemia Father    Kidney Stones Father    Stroke Maternal Grandmother    Heart attack Maternal Grandfather    Leukemia Paternal Grandfather    Rheum arthritis Neg Hx    Colon cancer Neg Hx    Esophageal cancer Neg Hx    Allergies as of 07/16/2024   No Known Allergies      Medication List        Accurate as of July 16, 2024  3:07 PM. If you have any questions, ask your nurse or doctor.  STOP taking these medications    Citrucel oral powder Generic drug: methylcellulose Stopped by: Charlies Bellini       TAKE these medications    AMBULATORY NON FORMULARY MEDICATION Medication Name: FODZYMES- use as directed   atorvastatin  10 MG tablet Commonly known as: LIPITOR Take 1 tablet (10 mg total) by mouth daily.   estradiol  0.05 MG/24HR patch Commonly known as: VIVELLE -DOT Place 1 patch (0.05 mg total) onto the skin 2 (two) times a week.   fluocinonide ointment 0.05 % Commonly known as: LIDEX Apply 1 Application topically 2 (two) times daily. Started by: Charlies Bellini   GAS-X EXTRA STRENGTH PO Take 2 tablets by mouth 2 (two) times daily.   IBgard 90 MG Cpcr Generic drug: Peppermint Oil Take as directed   progesterone  200 MG capsule Commonly known as: Prometrium  Take 1 capsule (200 mg total) by mouth daily. Take at bedtime.   verapamil  180 MG CR tablet Commonly known as: CALAN -SR Take 1 tablet (180 mg total) by mouth every morning.   VITAMIN D (CHOLECALCIFEROL) PO Take  1,000 Units by mouth daily. Naturemade D3        All past medical history, surgical history, allergies, family history, immunizations andmedications were updated in the EMR today and reviewed under the history and medication portions of their EMR.     ROS Negative, with the exception of above mentioned in HPI   Objective:  BP 126/80   Pulse 83   Temp 98.4 F (36.9 C)   Wt 162 lb (73.5 kg)   LMP 08/31/2021 (Approximate)   SpO2 98%   BMI 26.15 kg/m  Body mass index is 26.15 kg/m. Physical Exam Vitals and nursing note reviewed.  Constitutional:      General: She is not in acute distress.    Appearance: Normal appearance. She is normal weight. She is not ill-appearing or toxic-appearing.  HENT:     Head: Normocephalic and atraumatic.  Eyes:     General: No scleral icterus.       Right eye: No discharge.        Left eye: No discharge.     Extraocular Movements: Extraocular movements intact.     Conjunctiva/sclera: Conjunctivae normal.     Pupils: Pupils are equal, round, and reactive to light.  Skin:    Findings: Rash (Fine raised mildly red rash bilateral lower extremities posterior calf) present.  Neurological:     Mental Status: She is alert and oriented to person, place, and time. Mental status is at baseline.     Motor: No weakness.     Coordination: Coordination normal.     Gait: Gait normal.  Psychiatric:        Mood and Affect: Mood normal.        Behavior: Behavior normal.        Thought Content: Thought content normal.        Judgment: Judgment normal.    No results found. No results found. No results found for this or any previous visit (from the past 24 hours).  Assessment/Plan: Anna Mccullough is a 58 y.o. female present for OV for  Venous stasis dermatitis (Primary) Compression stockings nd keep legs elevated Lidex ointment BID with flares.  Prn  Need for influenza vaccination Influenza vaccine provided today   Reviewed expectations re:  course of current medical issues. Discussed self-management of symptoms. Outlined signs and symptoms indicating need for more acute intervention. Patient verbalized understanding and all questions were answered. Patient received an After-Visit  Summary.    Orders Placed This Encounter  Procedures   Flu vaccine trivalent PF, 6mos and older(Flulaval,Afluria,Fluarix,Fluzone)   Meds ordered this encounter  Medications   fluocinonide ointment (LIDEX) 0.05 %    Sig: Apply 1 Application topically 2 (two) times daily.    Dispense:  60 g    Refill:  5   Referral Orders  No referral(s) requested today     Note is dictated utilizing voice recognition software. Although note has been proof read prior to signing, occasional typographical errors still can be missed. If any questions arise, please do not hesitate to call for verification.   electronically signed by:  Charlies Bellini, DO  [facility] [place]

## 2024-07-16 NOTE — Patient Instructions (Addendum)

## 2024-07-16 NOTE — Telephone Encounter (Signed)
 I am receiving multiple messages from patient, staff and pharmacy concerning this prescription.  Please call the pharmacy and clarify. I had replaced the ointment prescription with the cream prescription for patient is requesting pharmacy's request.   Please make sure they received this and they have it in stock

## 2024-07-18 NOTE — Telephone Encounter (Signed)
 Spoke with pharmacy they do have the cream in stock and will fill it now. Pt advised via mychart.

## 2024-07-21 ENCOUNTER — Ambulatory Visit: Admitting: Obstetrics and Gynecology

## 2024-07-30 ENCOUNTER — Other Ambulatory Visit: Payer: Self-pay

## 2024-07-30 ENCOUNTER — Encounter: Payer: Self-pay | Admitting: Family Medicine

## 2024-07-30 ENCOUNTER — Encounter: Payer: Self-pay | Admitting: Obstetrics and Gynecology

## 2024-07-30 DIAGNOSIS — R002 Palpitations: Secondary | ICD-10-CM

## 2024-07-30 DIAGNOSIS — I10 Essential (primary) hypertension: Secondary | ICD-10-CM

## 2024-07-30 MED ORDER — VERAPAMIL HCL ER 180 MG PO TBCR: 180.0000 mg | EXTENDED_RELEASE_TABLET | Freq: Every morning | ORAL | 0 refills | Status: DC

## 2024-07-30 MED ORDER — ATORVASTATIN CALCIUM 10 MG PO TABS: 10.0000 mg | ORAL_TABLET | Freq: Every day | ORAL | 0 refills | Status: DC

## 2024-07-30 MED ORDER — PROGESTERONE 200 MG PO CAPS: 200.0000 mg | ORAL_CAPSULE | Freq: Every day | ORAL | 2 refills | Status: AC

## 2024-07-30 MED ORDER — FLUOCINONIDE EMULSIFIED BASE 0.05 % EX CREA: 1.0000 | TOPICAL_CREAM | Freq: Two times a day (BID) | CUTANEOUS | 5 refills | Status: AC

## 2024-07-30 MED ORDER — ESTRADIOL 0.05 MG/24HR TD PTTW: 1.0000 | MEDICATED_PATCH | TRANSDERMAL | 2 refills | Status: AC

## 2024-07-30 NOTE — Telephone Encounter (Signed)
 Mychart message from patient.   I have changed insurance and I'm now covered through [facility]. The pharmacy is called [facility]    I spoke to customer service this morning and they do not accept Rx transferred from other pharmacy's. A new Rx will need to be send to them. Can you please send them my two Rx's.    Progesterone  caps 200mg  Estradiol  0.05mg  Patch (2/wk)   I am due for the Estradiol  patch refill soon.    Customer service provided to ensure "[facility]" was selected and there are two address selection of [place]  or [place] .    Requesting to stay with the 90 day supply, please.

## 2024-09-30 ENCOUNTER — Other Ambulatory Visit: Payer: Self-pay | Admitting: Family Medicine

## 2024-09-30 DIAGNOSIS — I10 Essential (primary) hypertension: Secondary | ICD-10-CM

## 2024-09-30 DIAGNOSIS — R002 Palpitations: Secondary | ICD-10-CM

## 2024-10-16 ENCOUNTER — Ambulatory Visit: Payer: Self-pay

## 2024-10-16 NOTE — Telephone Encounter (Signed)
 FYI Only or Action Required?: Action required by provider: ok for appointment on 10/21/24 or would you like to work her in today or tomorrow.  Patient was last seen in primary care on 07/16/2024 by Catherine Fuller A, DO.  Called Nurse Triage reporting [place].  Symptoms began a week ago.  Interventions attempted: Other: changed deodorant .  Symptoms are: unchanged.  Triage Disposition: See Physician Within 24 Hours  Patient/caregiver understands and will follow disposition?: Yes, but will wait  Reason for Disposition  [1] Swelling is painful to touch AND [2] no fever  Answer Assessment - Initial Assessment Questions Patient called in for one week of left axilla puffiness. She thought she was having a reaction to her deodorant and changed product but puffiness remains. She reports mild puffiness that is intermittently 'sore' to touch. Denies fever, rashes, skin color changes, cold symptoms, and all other symptoms. Now wondering if this is an inflamed lymph node or ingrown hair, does not look like in grown hair. Offered acute visit today to evaluate but she declines this offer and would like to schedule with pcp next week. Scheduled next available with pcp on Tuesday 10/21/24. Discussed reasons to proceed to uc/er. Patient plans to continue with monitoring at home and will call back if worsening, develops new symptoms, or resolved.   1. APPEARANCE of SWELLING: What does it look like?     Slight puffy 2. SIZE: How large is the swelling? (e.g., inches, cm; or compare to size of pinhead, tip of pen, eraser, coin, pea, grape, ping pong ball)      Puffy looks like I have a little extra boob 3. LOCATION: Where is the swelling located?     Left axilla 4. ONSET: When did the swelling start?     Last week  5. COLOR: What color is it? Is there more than one color?     Flesh-no change 6. PAIN: Is there any pain? If Yes, ask: How bad is the pain? (Scale 1-10; or mild, moderate, severe)        Mild sore to touch intermittently, not sore each time she touches it 7. ITCH: Does it itch? If Yes, ask: How bad is the itch?      Denies  8. CAUSE: What do you think caused the swelling?     Lymph node vs ingrown hair vs deodorant 9 OTHER SYMPTOMS: Do you have any other symptoms? (e.g., fever)     Denies fever, cold symptoms, rashes, skin color changes, wounds, trauma, and all other symptoms.  Protocols used: Skin Lump or Localized Swelling-A-AH Message from [facility] sent at 10/16/2024  2:27 PM EST  Reason for Triage: pt stated her lymph nodes under her arms are sore and one side is swollen.

## 2024-10-21 ENCOUNTER — Encounter: Payer: Self-pay | Admitting: Family Medicine

## 2024-10-21 ENCOUNTER — Ambulatory Visit (INDEPENDENT_AMBULATORY_CARE_PROVIDER_SITE_OTHER): Admitting: Family Medicine

## 2024-10-21 VITALS — BP 124/78 | HR 91 | Temp 98.3°F | Wt 168.4 lb

## 2024-10-21 DIAGNOSIS — N644 Mastodynia: Secondary | ICD-10-CM | POA: Diagnosis not present

## 2024-10-21 DIAGNOSIS — M79622 Pain in left upper arm: Secondary | ICD-10-CM | POA: Diagnosis not present

## 2024-10-21 NOTE — Patient Instructions (Signed)

## 2024-10-21 NOTE — Progress Notes (Signed)
 "      Anna Mccullough , 1965-11-10, 59 y.o., female MRN: [id number] Patient Care Team    Relationship Specialty Notifications Start End  Catherine Charlies LABOR, DO PCP - General Family Medicine  10/20/19   Heide Ingle, MD Consulting Physician Orthopedic Surgery  10/23/19   Robinson Idol, MD Consulting Physician Ophthalmology  10/23/19   Darcey Lamar HERO, DPM Referring Physician Podiatry  05/09/24   Armbruster, Elspeth SQUIBB, MD Consulting Physician Gastroenterology  05/09/24   Cathlyn JAYSON Nikki Bobie FORBES, MD Consulting Physician Obstetrics and Gynecology  05/09/24     Chief Complaint  Patient presents with   Arm Pain    2 weeks; L armpit. Soreness, slight swelling.     Subjective: Anna Mccullough is a 59 y.o. Pt presents for an OV with complaints of left lateral breast/medial axillary pain of 2 weeks  duration.  Associated symptoms include a generalized soreness in this area and occasional throbbing pain.  She states he has not had any injury, illness or immunizations recently.  She denies any fever, abscess, redness or drainage.  She states she feels this area is swollen, but she could not feel any masses. Mammogram is up-to-date 10/2023. No personal or family history of breast cancer.    10/21/2024    1:53 PM 02/06/2024    9:39 AM 11/21/2022    8:59 AM 11/21/2021   11:04 AM 11/18/2020    9:24 AM  Depression screen PHQ 2/9  Decreased Interest 0 0 0 0 0  Down, Depressed, Hopeless 0 0 0 0 0  PHQ - 2 Score 0 0 0 0 0  Altered sleeping 0      Tired, decreased energy 0      Change in appetite 0      Feeling bad or failure about yourself  0      Trouble concentrating 0      Moving slowly or fidgety/restless 0      Suicidal thoughts 0      PHQ-9 Score 0      Difficult doing work/chores Not difficult at all        Allergies[1] Social History   Social History Narrative   Marital status/children/pets: Married.  2 children.   Education/employment: [employer] of arts degree.  Works as a  designer, fashion/clothing.   Safety:      -smoke alarm in the home:Yes     - wears seatbelt: Yes     - Feels safe in their relationships: Yes   Past Medical History:  Diagnosis Date   Acute pain of right knee 10/23/2019   Arthritis    ASCUS on Pap smear 2009   C&B 2009-BENIGN   Cervical radiculopathy 10/23/2019   c6-7 mild degenerative changes.    Elevated hemoglobin A1c 11/23/2023   6.0   HTN (hypertension)    Hyperlipidemia    Migraine    Nephrolithiasis    Rabies exposure 02/02/2019   Past Surgical History:  Procedure Laterality Date   BASAL CELL CARCINOMA EXCISION     CESAREAN SECTION  1999   CESAREAN SECTION  2004   COLPOSCOPY  2009   B9   KIDNEY SURGERY     REMOVAL OF KIDNEY STONES X 2   KNEE SURGERY  1988   RIGHT KNEE SURGERY   Family History  Problem Relation Age of Onset   Hypertension Mother    Cancer Father        prostate   Hypertension Father    Heart  attack Father    Hyperlipidemia Father    Kidney Stones Father    Stroke Maternal Grandmother    Heart attack Maternal Grandfather    Leukemia Paternal Grandfather    Rheum arthritis Neg Hx    Colon cancer Neg Hx    Esophageal cancer Neg Hx    Allergies as of 10/21/2024   No Known Allergies      Medication List        Accurate as of October 21, 2024  2:13 PM. If you have any questions, ask your nurse or doctor.          AMBULATORY NON FORMULARY MEDICATION Medication Name: FODZYMES- use as directed   atorvastatin  10 MG tablet Commonly known as: LIPITOR TAKE ONE TABLET BY MOUTH EVERY DAY   estradiol  0.05 MG/24HR patch Commonly known as: VIVELLE -DOT Place 1 patch (0.05 mg total) onto the skin 2 (two) times a week.   fluocinonide -emollient 0.05 % cream Commonly known as: LIDEX -E Apply 1 Application topically 2 (two) times daily.   GAS-X EXTRA STRENGTH PO Take 2 tablets by mouth 2 (two) times daily.   IBgard 90 MG Cpcr Generic drug: Peppermint Oil Take as directed   progesterone  200  MG capsule Commonly known as: Prometrium  Take 1 capsule (200 mg total) by mouth daily. Take at bedtime.   verapamil  180 MG CR tablet Commonly known as: CALAN -SR TAKE ONE TABLET BY MOUTH EVERY DAY IN THE MORNING (DO NOT CRUSH OR CHEW)   VITAMIN D (CHOLECALCIFEROL) PO Take 1,000 Units by mouth daily. Naturemade D3        All past medical history, surgical history, allergies, family history, immunizations andmedications were updated in the EMR today and reviewed under the history and medication portions of their EMR.     ROS Negative, with the exception of above mentioned in HPI   Objective:  BP 124/78   Pulse 91   Temp 98.3 F (36.8 C)   Wt 168 lb 6.4 oz (76.4 kg)   LMP 08/31/2021   SpO2 98%   BMI 27.18 kg/m  Body mass index is 27.18 kg/m. Physical Exam Vitals and nursing note reviewed.  Constitutional:      General: She is not in acute distress.    Appearance: Normal appearance. She is normal weight. She is not ill-appearing or toxic-appearing.  HENT:     Head: Normocephalic and atraumatic.  Eyes:     General: No scleral icterus.       Right eye: No discharge.        Left eye: No discharge.     Extraocular Movements: Extraocular movements intact.     Conjunctiva/sclera: Conjunctivae normal.     Pupils: Pupils are equal, round, and reactive to light.  Chest:     Chest wall: Swelling and tenderness present.  Breasts:    Breasts are asymmetrical.     Left: No mass or skin change.    Lymphadenopathy:     Upper Body:     Left upper body: No supraclavicular or axillary adenopathy.  Skin:    Findings: No rash.  Neurological:     Mental Status: She is alert and oriented to person, place, and time. Mental status is at baseline.     Motor: No weakness.     Coordination: Coordination normal.     Gait: Gait normal.  Psychiatric:        Mood and Affect: Mood normal.        Behavior: Behavior normal.  Thought Content: Thought content normal.        Judgment:  Judgment normal.      No results found. No results found. No results found for this or any previous visit (from the past 24 hours).  Assessment/Plan: Anna Mccullough is a 59 y.o. female present for OV for  Breast pain, left (Primary)/Left axillary pain Swelling and tenderness present left superior lateral breast/axilla region-tail of [place] area.  No discrete mass palpated but there is a generalized fullness present visually and to palpation. No injury, illness or immunization to suggest potential illness. Need to move forward with diagnostic mammogram and ultrasound ordered stat for further evaluation. Addendum: Location imaging study needed to be changed to the [facility] due to no breast ultrasounds are able to be completed at med centers.  Reviewed expectations re: course of current medical issues. Discussed self-management of symptoms. Outlined signs and symptoms indicating need for more acute intervention. Patient verbalized understanding and all questions were answered. Patient received an After-Visit Summary.    Orders Placed This Encounter  Procedures   US  BREAST COMPLETE UNI LEFT INC AXILLA   MM 3D DIAGNOSTIC MAMMOGRAM BILATERAL BREAST   No orders of the defined types were placed in this encounter.  Referral Orders  No referral(s) requested today     Note is dictated utilizing voice recognition software. Although note has been proof read prior to signing, occasional typographical errors still can be missed. If any questions arise, please do not hesitate to call for verification.   electronically signed by:  Charlies Bellini, DO  [facility] [place]       [1] No Known Allergies  "

## 2024-10-22 ENCOUNTER — Telehealth: Payer: Self-pay | Admitting: Family Medicine

## 2024-10-22 NOTE — Telephone Encounter (Signed)
 Pt aware and verbalized understanding.

## 2024-10-22 NOTE — Telephone Encounter (Signed)
 Please call patient and inform that we needed to change the location of the mammogram and ultrasound to the breast [facility]. The med centers have stopped performing breast ultrasounds and diagnostic mammo's.

## 2024-10-22 NOTE — Addendum Note (Signed)
 Addended by: Jaycey Gens A on: 10/22/2024 04:00 PM   Modules accepted: Orders

## 2024-10-23 ENCOUNTER — Encounter: Payer: Self-pay | Admitting: Family Medicine

## 2024-10-24 ENCOUNTER — Ambulatory Visit
Admission: RE | Admit: 2024-10-24 | Discharge: 2024-10-24 | Disposition: A | Source: Ambulatory Visit | Attending: Family Medicine | Admitting: Family Medicine

## 2024-10-24 DIAGNOSIS — M79622 Pain in left upper arm: Secondary | ICD-10-CM

## 2024-10-24 DIAGNOSIS — N644 Mastodynia: Secondary | ICD-10-CM

## 2024-10-24 NOTE — Telephone Encounter (Signed)
 No further action needed at this time.

## 2024-10-30 ENCOUNTER — Other Ambulatory Visit

## 2024-10-30 ENCOUNTER — Encounter

## 2024-12-08 ENCOUNTER — Encounter: Admitting: Family Medicine

## 2025-02-11 ENCOUNTER — Ambulatory Visit: Admitting: Obstetrics and Gynecology
